# Patient Record
Sex: Female | Born: 1965 | Race: Black or African American | Hispanic: No | Marital: Married | Smoking: Never smoker
Health system: Southern US, Community
[De-identification: ages and names within clinical notes are randomized; demographics above are authoritative.]

## PROBLEM LIST (undated history)

## (undated) DIAGNOSIS — G5603 Carpal tunnel syndrome, bilateral upper limbs: Secondary | ICD-10-CM

## (undated) DIAGNOSIS — T7840XA Allergy, unspecified, initial encounter: Secondary | ICD-10-CM

## (undated) DIAGNOSIS — I10 Essential (primary) hypertension: Secondary | ICD-10-CM

## (undated) DIAGNOSIS — M1990 Unspecified osteoarthritis, unspecified site: Secondary | ICD-10-CM

## (undated) DIAGNOSIS — D649 Anemia, unspecified: Secondary | ICD-10-CM

## (undated) DIAGNOSIS — R51 Headache: Secondary | ICD-10-CM

## (undated) HISTORY — DX: Carpal tunnel syndrome, bilateral upper limbs: G56.03

## (undated) HISTORY — PX: OTHER SURGICAL HISTORY: SHX169

## (undated) HISTORY — DX: Allergy, unspecified, initial encounter: T78.40XA

## (undated) HISTORY — PX: DILATION AND EVACUATION: SHX1459

## (undated) HISTORY — PX: CRYOABLATION: SHX1415

---

## 2000-08-31 ENCOUNTER — Emergency Department (HOSPITAL_COMMUNITY): Admission: EM | Admit: 2000-08-31 | Discharge: 2000-08-31 | Payer: Self-pay

## 2003-03-03 ENCOUNTER — Encounter: Payer: Self-pay | Admitting: Family Medicine

## 2003-03-03 ENCOUNTER — Encounter: Admission: RE | Admit: 2003-03-03 | Discharge: 2003-03-03 | Payer: Self-pay | Admitting: Family Medicine

## 2003-03-25 ENCOUNTER — Other Ambulatory Visit: Admission: RE | Admit: 2003-03-25 | Discharge: 2003-03-25 | Payer: Self-pay | Admitting: Obstetrics & Gynecology

## 2004-03-23 ENCOUNTER — Other Ambulatory Visit: Admission: RE | Admit: 2004-03-23 | Discharge: 2004-03-23 | Payer: Self-pay | Admitting: Obstetrics & Gynecology

## 2005-01-31 ENCOUNTER — Encounter: Admission: RE | Admit: 2005-01-31 | Discharge: 2005-01-31 | Payer: Self-pay | Admitting: Family Medicine

## 2005-08-18 ENCOUNTER — Other Ambulatory Visit: Admission: RE | Admit: 2005-08-18 | Discharge: 2005-08-18 | Payer: Self-pay | Admitting: Obstetrics & Gynecology

## 2005-11-02 ENCOUNTER — Ambulatory Visit (HOSPITAL_COMMUNITY): Admission: RE | Admit: 2005-11-02 | Discharge: 2005-11-02 | Payer: Self-pay | Admitting: Obstetrics and Gynecology

## 2005-11-02 ENCOUNTER — Encounter (INDEPENDENT_AMBULATORY_CARE_PROVIDER_SITE_OTHER): Payer: Self-pay | Admitting: Specialist

## 2005-11-06 ENCOUNTER — Inpatient Hospital Stay (HOSPITAL_COMMUNITY): Admission: AD | Admit: 2005-11-06 | Discharge: 2005-11-06 | Payer: Self-pay | Admitting: Obstetrics and Gynecology

## 2006-02-15 ENCOUNTER — Encounter: Admission: RE | Admit: 2006-02-15 | Discharge: 2006-02-15 | Payer: Self-pay | Admitting: Obstetrics and Gynecology

## 2007-03-30 ENCOUNTER — Encounter: Admission: RE | Admit: 2007-03-30 | Discharge: 2007-03-30 | Payer: Self-pay | Admitting: Family Medicine

## 2007-04-05 ENCOUNTER — Encounter: Admission: RE | Admit: 2007-04-05 | Discharge: 2007-04-05 | Payer: Self-pay | Admitting: Family Medicine

## 2007-12-17 ENCOUNTER — Ambulatory Visit: Payer: Self-pay | Admitting: Internal Medicine

## 2007-12-24 ENCOUNTER — Ambulatory Visit: Payer: Self-pay

## 2007-12-24 ENCOUNTER — Ambulatory Visit: Payer: Self-pay | Admitting: Internal Medicine

## 2007-12-24 LAB — CONVERTED CEMR LAB
CRP, High Sensitivity: 6 — ABNORMAL HIGH (ref 0.00–5.00)
Rhuematoid fact SerPl-aCnc: 20.1 intl units/mL — ABNORMAL HIGH (ref 0.0–20.0)

## 2008-02-08 ENCOUNTER — Ambulatory Visit: Admission: RE | Admit: 2008-02-08 | Discharge: 2008-02-08 | Payer: Self-pay | Admitting: Internal Medicine

## 2008-04-17 ENCOUNTER — Ambulatory Visit: Payer: Self-pay | Admitting: Internal Medicine

## 2008-04-17 ENCOUNTER — Encounter: Admission: RE | Admit: 2008-04-17 | Discharge: 2008-04-17 | Payer: Self-pay | Admitting: Obstetrics and Gynecology

## 2008-04-17 LAB — CONVERTED CEMR LAB
AST: 20 units/L (ref 0–37)
Alkaline Phosphatase: 44 units/L (ref 39–117)
Bilirubin, Direct: 0.1 mg/dL (ref 0.0–0.3)
Total Bilirubin: 0.6 mg/dL (ref 0.3–1.2)
Total Protein: 7.3 g/dL (ref 6.0–8.3)

## 2008-07-14 ENCOUNTER — Ambulatory Visit: Payer: Self-pay | Admitting: Internal Medicine

## 2008-07-29 ENCOUNTER — Encounter: Admission: RE | Admit: 2008-07-29 | Discharge: 2008-09-02 | Payer: Self-pay | Admitting: Internal Medicine

## 2008-12-10 ENCOUNTER — Encounter: Payer: Self-pay | Admitting: Family Medicine

## 2009-04-30 ENCOUNTER — Encounter: Admission: RE | Admit: 2009-04-30 | Discharge: 2009-04-30 | Payer: Self-pay | Admitting: Obstetrics and Gynecology

## 2009-07-10 ENCOUNTER — Encounter (INDEPENDENT_AMBULATORY_CARE_PROVIDER_SITE_OTHER): Payer: Self-pay | Admitting: *Deleted

## 2010-01-01 ENCOUNTER — Encounter: Payer: Self-pay | Admitting: Family Medicine

## 2010-01-15 ENCOUNTER — Ambulatory Visit: Payer: Self-pay | Admitting: Family Medicine

## 2010-01-15 DIAGNOSIS — J309 Allergic rhinitis, unspecified: Secondary | ICD-10-CM | POA: Insufficient documentation

## 2010-01-15 DIAGNOSIS — R519 Headache, unspecified: Secondary | ICD-10-CM | POA: Insufficient documentation

## 2010-01-15 DIAGNOSIS — M1990 Unspecified osteoarthritis, unspecified site: Secondary | ICD-10-CM | POA: Insufficient documentation

## 2010-01-15 DIAGNOSIS — I10 Essential (primary) hypertension: Secondary | ICD-10-CM | POA: Insufficient documentation

## 2010-01-15 DIAGNOSIS — R51 Headache: Secondary | ICD-10-CM

## 2010-01-15 DIAGNOSIS — E785 Hyperlipidemia, unspecified: Secondary | ICD-10-CM

## 2010-05-17 ENCOUNTER — Encounter: Admission: RE | Admit: 2010-05-17 | Discharge: 2010-05-17 | Payer: Self-pay | Admitting: Obstetrics and Gynecology

## 2011-01-02 ENCOUNTER — Encounter: Payer: Self-pay | Admitting: Orthopedic Surgery

## 2011-01-02 ENCOUNTER — Encounter: Payer: Self-pay | Admitting: Family Medicine

## 2011-01-11 NOTE — Assessment & Plan Note (Signed)
Summary: NEW PT EST (PT WILL COME IN FASTING) // RS   Vital Signs:  Patient profile:   45 year old female Height:      67 inches Weight:      189 pounds BMI:     29.71 Temp:     98.1 degrees F oral Pulse rate:   70 / minute BP sitting:   102 / 80  (left arm) Cuff size:   large  Vitals Entered By: Alfred Levins, CMA (January 15, 2010 9:22 AM) CC: npx, no pap   History of Present Illness: 45 yr old female to establish with [place] and for a cpx. She is transfering to [place] from [place] [facility] at [facility]. She recently had a GYN exam, and they did some lab work that was remarkable for an LDL of 133 along with normal HDL and TG. She feels fine. She admits to not exercising much and eating a lot of fast foods.   Preventive Screening-Counseling & Management  Alcohol-Tobacco     Smoking Status: never  Current Medications (verified): 1)  Triamterene-Hctz 37.5-25 Mg  Tabs (Triamterene-Hctz) .... One By Mouth Daily 2)  Fexofenadine Hcl 180 Mg Tabs (Fexofenadine Hcl) .Marland Kitchen.. 1 Once Daily As Needed Allergies 3)  Levora 0.15/30 (28) 0.15-30 Mg-Mcg Tabs (Levonorgestrel-Ethinyl Estrad) .... Once Daily  Allergies (verified): 1)  ! Codeine  Past History:  Past Medical History: Allergic rhinitis Headache Hyperlipidemia Hypertension Osteoarthritis Chickenpox sees Rolene Course PA at [facility] for exams ectopic pregnancy, no surgery required uterine fibroids  Past Surgical History: D and C 1993  Family History: Reviewed history and no changes required. Family History of Alcoholism/Addiction Family History of Arthritis Family History Breast cancer 1st degree relative <50 Family History of CAD Female 1st degree relative <50 Family History Diabetes 1st degree relative Family History Hypertension  Social History: Reviewed history and no changes required. 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    Physical Exam  General:  overweight-appearing.   Head:  Normocephalic and atraumatic without obvious abnormalities. No apparent alopecia or balding. Eyes:  No corneal or conjunctival inflammation noted. EOMI. Perrla. Funduscopic exam benign, without hemorrhages, exudates or papilledema. Vision grossly normal. Ears:  External ear exam shows no significant lesions or deformities.  Otoscopic examination reveals clear canals, tympanic membranes are intact bilaterally without bulging, retraction, inflammation or discharge. Hearing is grossly normal bilaterally. Nose:  External nasal examination shows no deformity or inflammation. Nasal mucosa are pink and moist without lesions or exudates. Mouth:  Oral mucosa and oropharynx without lesions or exudates.  Teeth in good repair. Neck:  No deformities, masses, or tenderness noted. Chest Wall:  No deformities, masses, or tenderness noted. Lungs:  Normal respiratory effort, chest expands symmetrically. Lungs are clear to auscultation, no crackles or wheezes. Heart:  Normal rate and regular rhythm. S1 and S2 normal without gallop, murmur, click, rub or other extra sounds. EKG normal Abdomen:  Bowel sounds positive,abdomen soft and non-tender without masses, organomegaly or hernias noted. Msk:  No deformity or scoliosis noted of thoracic  or lumbar spine.   Pulses:  R and L carotid,radial,femoral,dorsalis pedis and posterior tibial  pulses are full and equal bilaterally Extremities:  No clubbing, cyanosis, edema, or deformity noted with normal full range of motion of all joints.   Neurologic:  No cranial nerve deficits noted. Station and gait are normal. Plantar reflexes are down-going bilaterally. DTRs are symmetrical throughout. Sensory, motor and coordinative functions appear intact. Skin:  Intact without suspicious lesions or rashes Cervical Nodes:  No lymphadenopathy noted Axillary Nodes:  No palpable lymphadenopathy Inguinal Nodes:  No significant adenopathy Psych:  Cognition and judgment appear intact. Alert and cooperative with normal attention span and concentration. No apparent delusions, illusions, hallucinations   Impression & Recommendations:  Problem # 1:  HEALTH MAINTENANCE EXAM (ICD-V70.0)  Orders: UA Dipstick w/o Micro (automated)  (81003) Venipuncture (03474) TLB-TSH (Thyroid Stimulating Hormone) (84443-TSH)  Complete Medication List: 1)  Triamterene-hctz 37.5-25 Mg Tabs (Triamterene-hctz) .... One by mouth daily 2)  Fexofenadine Hcl 180 Mg Tabs (Fexofenadine hcl) .Marland Kitchen.. 1 once daily as needed allergies 3)  Levora 0.15/30 (28) 0.15-30 Mg-mcg Tabs (Levonorgestrel-ethinyl estrad) .... Once daily  Other Orders: EKG w/ Interpretation (93000)  Patient Instructions: 1)  It is important that you exercise reguarly at least 20 minutes 5 times a week. If you develop chest pain, have severe difficulty breathing, or feel very tired, stop exercising immediately and seek medical attention.  2)  You need to lose weight. Consider a lower calorie diet and regular exercise.  3)  Please schedule a follow-up appointment in 6 months .   Appended Document: NEW PT EST (PT WILL COME IN FASTING) // RS  Laboratory Results   Urine Tests    Routine Urinalysis   Color: yellow Appearance: Clear Glucose: negative   (Normal Range: Negative) Bilirubin: negative   (Normal Range: Negative) Ketone: negative   (Normal  Range: Negative) Spec. Gravity: 1.025   (Normal Range: 1.003-1.035) Blood: trace-lysed   (Normal Range: Negative) pH: 5.5   (Normal Range: 5.0-8.0) Protein: trace   (Normal Range: Negative) Urobilinogen: 0.2   (Normal Range: 0-1) Nitrite: negative   (Normal Range: Negative) Leukocyte Esterace: negative   (Normal Range: Negative)    Comments: Rita Ohara  January 15, 2010 1:34 PM

## 2011-01-11 NOTE — Letter (Signed)
Summary: Records from [facility]  Records from [facility]   Imported By: Maryln Gottron 02/05/2010 15:36:06  _____________________________________________________________________  External Attachment:    Type:   Image     Comment:   External Document

## 2011-04-13 ENCOUNTER — Other Ambulatory Visit: Payer: Self-pay | Admitting: Obstetrics and Gynecology

## 2011-04-19 ENCOUNTER — Other Ambulatory Visit: Payer: Self-pay | Admitting: Obstetrics and Gynecology

## 2011-04-26 NOTE — Assessment & Plan Note (Signed)
[facility]                            CARDIOLOGY OFFICE NOTE   Morgan Doyle, Morgan Doyle                      MRN:          [id number]  DATE:07/14/2008                            DOB:          27-Jul-1966    IDENTIFICATION:  The patient is a 45 year old woman who I saw back in  January for evaluation of chest pain.  I refer this for full details.   In the interval, the patient denies any recurrence.  She denies  shortness of breath, is able to take activities, is slowly trying to  increase her activity level.   The patient called our office and said she would like to try diet  therapy for her cholesterol.  She really would like to avoid been on  medicines if possible.  She also had concerns whether she really needed  her blood pressure medicine.  She has been feeling okay on it.   The patient was seen by Dr. Corliss Skains in January.  She had some blood  work done, but did not have any followup set.   CURRENT MEDICATIONS:  1. Triamterene and hydrochlorothiazide 37.5/25.  2. Allegra daily.  3. Oral contraceptive.   PHYSICAL EXAM:  GENERAL:  The patient is in no distress.  VITAL SIGNS:  Blood pressure is 127/86, pulse is 78, and weight 194.  This is up from 188 in January.  NECK:  JVP is normal.  LUNGS:  Clear to auscultation.  CARDIAC:  Regular rate and rhythm.  S1 and S2.  No S3.  No significant  murmurs.  ABDOMEN:  Benign.  EXTREMITIES:  Good distal pulses.  No lower extremity edema.   IMPRESSION:  1. Chest pain, atypical.  When I saw her, I am not convinced her any      active issues.  Would follow p.r.n.  2. Dyslipidemia.  Reasonable to try to change her diet.  Pulled down      her weight some.  I will send her off to dietary for evaluation and      teaching.  3. Hypertension.  Blood pressure is fair.  Her diastolic is always in      the 80s.  If she can get her weight down, she may be pulled back on      the Maxzide.  She could try cutting  back to half daily and follow      her blood pressure, but again she will need to follow closely.   I will set follow up for June, sooner if problems develop.   ADDENDUM:  12-lead EKG, sinus rhythm 78 beats per minute.     Pricilla Riffle, MD, [facility]  Electronically Signed    PVR/MedQ  DD: 07/15/2008  DT: 07/16/2008  Job #: [id number]

## 2011-04-26 NOTE — Assessment & Plan Note (Signed)
[facility]                            CARDIOLOGY OFFICE NOTE   Morgan Doyle, Morgan Doyle                      MRN:          [id number]  DATE:12/17/2007                            DOB:          1966-01-31    IDENTIFICATION:  Morgan Doyle is a 45 year old woman who was referred for  evaluation of chest discomfort.   HISTORY OF PRESENT ILLNESS:  The patient has no known history of  coronary artery disease. She said she has had some sharp pains in her  chest that last seconds, radiates through. She denies any recent or even  remote injury to her chest or episode of over exertion. This began about  September/October.   She also gives a history of some tightness in her chest. Her chest will  feel sore, particularly when she is laying down. In November, she did  have some wheezing and actually was seen and questioned in [place]  and told she did have wheezing. She has heartburn occasionally. She is  not having any wheezing now but sometimes feels she cannot get her  breath. She has no history of asthma. No history of tobacco use.   She takes activities as tolerated. She is not doing an active exercise  program.   CURRENT ALLERGIES:  CODEINE.   MEDICATIONS:  Include:  1. Triamterene hydrochlorothiazide 37.5/25.  2. Allegra daily.  3. Birth control.   PAST MEDICAL HISTORY:  1. Question rheumatoid arthritis. The patient [facility]. Her left finger had      inflammation and actually was aspirated. She told us at that time      she had rheumatoid arthritis. Blood work was also done.  2. Hypertension. Has been on Maxzide, back in 2001 initially was on      hydrochlorothiazide, switched to Maxzide in 2006.   SOCIAL HISTORY:  The patient is married. Does not smoke. Does not drink.  Works in Set designer, initially standing, now sitting.   FAMILY HISTORY:  Mother is alive at age 29. Father died at age  55-48 of  suffocation. One sister. No known heart disease. Maternal uncle had a MI  in his 48s and died suddenly. Maternal grandmother's sister died in her  50s. Maternal grandfather died of heart failure in his 28s.   REVIEW OF SYSTEMS:  Seasonal allergies, some constipation. Otherwise,  all systems reviewed and negative for the above problem list except as  noted.   PHYSICAL EXAMINATION:  The patient is in no distress. Blood pressure  117/85, pulse 83, weight 118.  HEENT:  Normocephalic, atraumatic. EOMI. PERRLA. Mucous membranes are  moist.  NECK:  No bruits. JVP is normal. No thyromegaly.  LUNGS:  Are clear. No audible wheezes. Moving air.  CARDIAC EXAM:  Regular rate and rhythm. S1/S2. No S3. No significant  murmurs.  ABDOMEN:  Is supple, nontender. No hepatomegaly. No masses.  EXTREMITIES:  2+ pulses. No edema. Right index finger:  The distal joint  is slightly swollen, deformed.   Twelve lead EKG:  Normal sinus  rhythm, 64 beats per minute, relatively  low voltage. EKG:  Nonspecific T-wave changes.   LABORATORY DATA:  Done at [facility] outside, hemoglobin is 14.5,  cholesterol 188, triglycerides 44, HDL 47, LDL of 132, CRP of 4.2.   IMPRESSION:  Morgan Doyle is a 45 year old woman with no known coronary  artery disease, episodes of chest pain, and also shortness of breath.   Her chest pain I do not think are cardiac. They are short, very brief,  sound more muscular. Her shortness of breath with the wheezing is more  concerning to me. She is moving air well. I do not hear any wheezing.  She does have some family members and has had a history of CHF in the  family. I would recommend getting an echocardiogram to evaluate her LV  function.   In regards to her labs, her CRP may be elevated because of her reported  rheumatoid arthritis. I would like to repeat this. I would also like to  get a Lipomed panel to really evaluate her particle size. Again,  determining if this is a  risk or not. For now, activities as tolerated,  and I will be back in touch with her. Consider lung function studies.  Consider other types of stress testing. Await echocardiogram results.     Pricilla Riffle, MD, [facility]  Electronically Signed    PVR/MedQ  DD: 12/18/2007  DT: 12/18/2007  Job #: [id number]   cc:   Leilani Able, P.A.-C.  Timothy P. Fontaine, M.D.

## 2011-04-29 NOTE — Letter (Signed)
December 31, 2007    Kathryne Hitch, MD  [address] [place], [place]  [address]   RE:  HOLDEN, DRAUGHON  MRN:  [id number]  /  DOB:  10-29-1966   Dear Dr. Corliss Skains:   This is a letter regarding Lucious Groves.  She is a 45 year old woman,  who I saw in the clinic for shortness of breath, dyspnea evaluation  earlier this month.   Ms. Mckeough has episodes of chest pain that appeared atypical, very  brief, sounded more muscular.  She also had episodes of shortness of  breath with some wheezing.  When I listened to her she was moving air  well with no evidence of wheezing.  I have set her up for PFT's.  She  has also had an echo which was overall normal.   She was told in the past that she has rheumatoid arthritis (1996). I  checked a CRP, and a rheumatoid factor.  The rheumatoid factor and CRP  were mildly elevated at 20.1 and 6.   I am referring her to you.  She does complain of some achiness.  I am  wondering if there is any unifying diagnosis  for some of her symptoms.  I do think she deserves a good rheumatologic evaluation.   I will forward the records that I have so far to you.  If you have any  questions, please feel free to contact me.    Sincerely,      Pricilla Riffle, MD, [facility]  Electronically Signed    PVR/MedQ  DD: 12/31/2007  DT: 12/31/2007  Job #: [id number]   CC:    Leilani Able, P.A.-C.

## 2011-04-29 NOTE — Op Note (Signed)
Morgan Doyle, Morgan Doyle NO.:  [id number]   MEDICAL RECORD NO.:  [id number]          PATIENT TYPE:  AMB   LOCATION:  SDC                           FACILITY:  [facility]   PHYSICIAN:  Malva Limes, M.D.    DATE OF BIRTH:  May 16, 1966   DATE OF PROCEDURE:  11/02/2005  DATE OF DISCHARGE:                                 OPERATIVE REPORT   PREOPERATIVE DIAGNOSES:  1.  Fetal demise at 70 weeks' estimated gestational age.  2.  Twenty-week uterine fibroids.   POSTOPERATIVE DIAGNOSES:  1.  Fetal demise at 8 weeks' estimated gestational age.  2.  Twenty-week uterine fibroids.   PROCEDURE:  Dilation and evacuation.   SURGEON:  Malva Limes, M.D.   ANESTHESIA:  MAC and paracervical block.   ESTIMATED BLOOD LOSS:  100 mL.   ANTIBIOTICS:  Ancef 1 g.   COMPLICATIONS:  None.   SPECIMENS:  Products of conception to pathology.   DRAINS:  Catheter to the bladder.   PROCEDURE:  The patient was taken to the operating room and placed in the  dorsal supine position and MAC anesthesia was placed.  She was then prepped  with Betadine and draped in the usual fashion for this procedure.  Her  bladder was drained with a catheter.  She then had 20 mL of 1% lidocaine  used for a paracervical block.  The cervix was then serially dilated to a 63  [place].  A 14 mm suction cannula was placed into the uterine cavity and  products of conception were withdrawn.  Sharp curettage was then performed,  followed by repeat suction.  The patient tolerated the procedure well.  She  was taken to the recovery room in stable condition.  She will be discharged  to home with Keflex 500 mg q.i.d. for two days and Percocet to take p.r.n.  She will be given RhoGAM prior to discharge.           ______________________________  Malva Limes, M.D.    MA/MEDQ  D:  11/02/2005  T:  11/02/2005  Job:  [id number]

## 2011-07-18 ENCOUNTER — Other Ambulatory Visit: Payer: Self-pay | Admitting: Obstetrics and Gynecology

## 2011-07-18 DIAGNOSIS — Z1231 Encounter for screening mammogram for malignant neoplasm of breast: Secondary | ICD-10-CM

## 2011-07-28 ENCOUNTER — Ambulatory Visit
Admission: RE | Admit: 2011-07-28 | Discharge: 2011-07-28 | Disposition: A | Discharge status: Home / Self Care | Payer: BC Managed Care – PPO | Source: Ambulatory Visit | Attending: Obstetrics and Gynecology | Admitting: Obstetrics and Gynecology

## 2011-07-28 DIAGNOSIS — Z1231 Encounter for screening mammogram for malignant neoplasm of breast: Secondary | ICD-10-CM

## 2011-08-05 ENCOUNTER — Encounter (HOSPITAL_COMMUNITY): Payer: Self-pay

## 2011-08-05 ENCOUNTER — Encounter (HOSPITAL_COMMUNITY)
Admission: RE | Admit: 2011-08-05 | Discharge: 2011-08-05 | Disposition: A | Discharge status: Home / Self Care | Payer: BC Managed Care – PPO | Source: Ambulatory Visit | Attending: Obstetrics and Gynecology | Admitting: Obstetrics and Gynecology

## 2011-08-05 HISTORY — DX: Essential (primary) hypertension: I10

## 2011-08-05 HISTORY — DX: Anemia, unspecified: D64.9

## 2011-08-05 HISTORY — DX: Headache: R51

## 2011-08-05 HISTORY — DX: Unspecified osteoarthritis, unspecified site: M19.90

## 2011-08-05 LAB — SURGICAL PCR SCREEN: MRSA, PCR: NEGATIVE

## 2011-08-05 LAB — CBC
Hemoglobin: 14.3 g/dL (ref 12.0–15.0)
MCH: 23.6 pg — ABNORMAL LOW (ref 26.0–34.0)
MCHC: 32.1 g/dL (ref 30.0–36.0)
Platelets: 255 10*3/uL (ref 150–400)
RDW: 19.8 % — ABNORMAL HIGH (ref 11.5–15.5)

## 2011-08-05 LAB — BASIC METABOLIC PANEL
Calcium: 9.9 mg/dL (ref 8.4–10.5)
Creatinine, Ser: 0.94 mg/dL (ref 0.50–1.10)
GFR calc Af Amer: >60 mL/min (ref >60)
GFR calc non Af Amer: >60 mL/min (ref >60)

## 2011-08-05 NOTE — Patient Instructions (Addendum)
20 Morgan Doyle  08/05/2011   Your procedure is scheduled on:  08/10/11  Report to [facility] at 6 AM.  Call this number if you have problems the morning of surgery: [phone]   Remember:   Do not eat food:After Midnight.  Do not drink clear liquids: After Midnight.  Take these medicines the morning of surgery with A SIP OF WATER: Blood pressure  medication   Do not wear jewelry, make-up or nail polish.  Do not wear lotions, powders, or perfumes. You may wear deodorant.  Do not shave 48 hours prior to surgery.  Do not bring valuables to the hospital.  Contacts, dentures or bridgework may not be worn into surgery.  Leave suitcase in the car. After surgery it may be brought to your room.  For patients admitted to the hospital, checkout time is 11:00 AM the day of discharge.   Patients discharged the day of surgery will not be allowed to drive home.  Name and phone number of your driver: NA  Special Instructions: CHG Shower Use Special Wash: 1/2 bottle night before surgery and 1/2 bottle morning of surgery.   Please read over the following fact sheets that you were given: MRSA Information and Care and Recovery After Surgery

## 2011-08-05 NOTE — Anesthesia Preprocedure Evaluation (Signed)
Anesthesia Evaluation  Name, MR# and DOB Patient awake  General Assessment Comment  Reviewed: Allergy & Precautions, H&P , Patient's Chart, lab work & pertinent test results  History of Anesthesia Complications Negative for: history of anesthetic complications  Airway Mallampati: I TM Distance: >3 FB Neck ROM: full    Dental  (+)    Pulmonary  clear to auscultation  breath sounds clear to auscultation none    Cardiovascular Exercise Tolerance: Good hypertension, On Medications regular Normal    Neuro/Psych   Headaches   Negative Psych ROS  GI/Hepatic/Renal negative GI ROS  negative Liver ROS  negative Renal ROS        Endo/Other  Negative Endocrine ROS (+)      Abdominal   Musculoskeletal   Hematology negative hematology ROS (+)   Peds  Reproductive/Obstetrics negative OB ROS    Anesthesia Other Findings             Anesthesia Physical Anesthesia Plan  ASA: II  Anesthesia Plan: General   Post-op Pain Management:    Induction:   Airway Management Planned:   Additional Equipment:   Intra-op Plan:   Post-operative Plan:   Informed Consent: I have reviewed the patients History and Physical, chart, labs and discussed the procedure including the risks, benefits and alternatives for the proposed anesthesia with the patient or authorized representative who has indicated his/her understanding and acceptance.   Dental Advisory Given  Plan Discussed with: CRNA and Surgeon  Anesthesia Plan Comments:         Anesthesia Quick Evaluation

## 2011-08-09 MED ORDER — DEXTROSE 5 % IV SOLN
2.0000 g | INTRAVENOUS | Status: AC
  Administered 2011-08-10: 2 g via INTRAVENOUS
  Filled 2011-08-09: qty 2

## 2011-08-09 NOTE — H&P (Signed)
45 y.o. yo complains of severe menorrhagia and symptomatic fibroid uterus.  Pt received Lupron to help shrink fibroids and to stave off bleeding until surgery could be done.  Pt desires definitive.    Past Medical History  Diagnosis Date  . Hypertension   . Anemia     History of anemia  . Headache     OTC excedrin   . Arthritis     no meds   Past Surgical History  Procedure Date  . Dilation and evacuation      2006  MAB  . Cryoablation     IN oFFICE PER Pt - 1990's  . Svd     1996    History   Social History  . Marital Status: Married    Spouse Name: N/A    Number of Children: N/A  . Years of Education: N/A   Occupational History  . Not on file.   Social History Main Topics  . Smoking status: Never Smoker   . Smokeless tobacco: Never Used  . Alcohol Use: No  . Drug Use: No  . Sexually Active: Yes    Birth Control/ Protection: Pill   Other Topics Concern  . Not on file   Social History Narrative  . No narrative on file    No current facility-administered medications on file prior to encounter.   No current outpatient prescriptions on file prior to encounter.    Allergies  Allergen Reactions  . Codeine Nausea And Vomiting    hallucination  . Pollen Extract Itching    Itching, sneezing, hives, watery eyes  . Latex Rash    Bumps and itching last time at hospital.    Vitals pending.  Lungs: clear to ascultation Cor:  RRR Abdomen:  soft, nontender, nondistended. Ex:  no cords, erythema Pelvic:  15-17 weeks size uterus, mobile but fibroid on anterior cervix.    U/S before Lupron- 14 x 11x 10.  Multiple fibroids several 3-5 cm both left and right lateral. One 4 cm fibroid anterior.  EMB:   Normal endometrium.  A:  D/W all options and she wishes to proceed with a Robotic TLH.  Ovaries to remain if appear normal.   P:  For Robotic TLH.   All risks, benefits and alternatives d/w patient and she desires to proceed.  Patient has undergone a modified  bowel prep and will receive preop antibiotics and SCDs during the operation.     Lynnae Ludemann A

## 2011-08-10 ENCOUNTER — Ambulatory Visit (HOSPITAL_COMMUNITY): Payer: BC Managed Care – PPO | Admitting: Anesthesiology

## 2011-08-10 ENCOUNTER — Encounter (HOSPITAL_COMMUNITY)
Admission: EM | Disposition: A | Discharge status: Home / Self Care | Payer: Self-pay | Source: Ambulatory Visit | Attending: Obstetrics and Gynecology

## 2011-08-10 ENCOUNTER — Encounter (HOSPITAL_COMMUNITY): Payer: Self-pay | Admitting: *Deleted

## 2011-08-10 ENCOUNTER — Encounter (HOSPITAL_COMMUNITY): Payer: Self-pay | Admitting: Anesthesiology

## 2011-08-10 ENCOUNTER — Other Ambulatory Visit: Payer: Self-pay | Admitting: Obstetrics and Gynecology

## 2011-08-10 ENCOUNTER — Ambulatory Visit (HOSPITAL_COMMUNITY)
Admission: EM | Admit: 2011-08-10 | Discharge: 2011-08-11 | Disposition: A | Discharge status: Home / Self Care | Payer: BC Managed Care – PPO | Source: Ambulatory Visit | Attending: Obstetrics and Gynecology | Admitting: Obstetrics and Gynecology

## 2011-08-10 DIAGNOSIS — N920 Excessive and frequent menstruation with regular cycle: Secondary | ICD-10-CM | POA: Insufficient documentation

## 2011-08-10 DIAGNOSIS — D219 Benign neoplasm of connective and other soft tissue, unspecified: Secondary | ICD-10-CM

## 2011-08-10 DIAGNOSIS — Z01818 Encounter for other preprocedural examination: Secondary | ICD-10-CM | POA: Insufficient documentation

## 2011-08-10 DIAGNOSIS — Z01812 Encounter for preprocedural laboratory examination: Secondary | ICD-10-CM | POA: Insufficient documentation

## 2011-08-10 DIAGNOSIS — N8000 Endometriosis of the uterus, unspecified: Secondary | ICD-10-CM | POA: Insufficient documentation

## 2011-08-10 DIAGNOSIS — D259 Leiomyoma of uterus, unspecified: Secondary | ICD-10-CM | POA: Insufficient documentation

## 2011-08-10 HISTORY — PX: ABDOMINAL HYSTERECTOMY: SHX81

## 2011-08-10 HISTORY — PX: CYSTOSCOPY: SHX5120

## 2011-08-10 LAB — PREGNANCY, URINE: Preg Test, Ur: NEGATIVE

## 2011-08-10 SURGERY — ROBOTIC ASSISTED TOTAL HYSTERECTOMY
Anesthesia: General

## 2011-08-10 MED ORDER — FENTANYL CITRATE 0.05 MG/ML IJ SOLN
INTRAMUSCULAR | Status: AC
  Filled 2011-08-10: qty 5

## 2011-08-10 MED ORDER — ONDANSETRON HCL 4 MG/2ML IJ SOLN
INTRAMUSCULAR | Status: AC
  Filled 2011-08-10: qty 2

## 2011-08-10 MED ORDER — LIDOCAINE HCL (CARDIAC) 20 MG/ML IV SOLN
INTRAVENOUS | Status: DC | PRN
  Administered 2011-08-10: 100 mg via INTRAVENOUS

## 2011-08-10 MED ORDER — LORATADINE 10 MG PO TABS
10.0000 mg | ORAL_TABLET | Freq: Every day | ORAL | Status: DC
  Filled 2011-08-10 (×2): qty 1

## 2011-08-10 MED ORDER — KETOROLAC TROMETHAMINE 60 MG/2ML IM SOLN
INTRAMUSCULAR | Status: AC
  Filled 2011-08-10: qty 2

## 2011-08-10 MED ORDER — LACTATED RINGERS IV SOLN
INTRAVENOUS | Status: DC
  Administered 2011-08-10: 10 mL/h via INTRAVENOUS

## 2011-08-10 MED ORDER — SCOPOLAMINE 1 MG/3DAYS TD PT72: 1.0000 | MEDICATED_PATCH | Freq: Once | TRANSDERMAL | Status: DC | PRN

## 2011-08-10 MED ORDER — HYDROMORPHONE HCL 1 MG/ML IJ SOLN
INTRAMUSCULAR | Status: AC
  Filled 2011-08-10: qty 1

## 2011-08-10 MED ORDER — PHENYLEPHRINE 40 MCG/ML (10ML) SYRINGE FOR IV PUSH (FOR BLOOD PRESSURE SUPPORT)
PREFILLED_SYRINGE | INTRAVENOUS | Status: AC
  Filled 2011-08-10: qty 5

## 2011-08-10 MED ORDER — MENTHOL 3 MG MT LOZG: 1.0000 | LOZENGE | OROMUCOSAL | Status: DC | PRN

## 2011-08-10 MED ORDER — GLYCOPYRROLATE 0.2 MG/ML IJ SOLN
INTRAMUSCULAR | Status: AC
  Filled 2011-08-10: qty 2

## 2011-08-10 MED ORDER — ZOLPIDEM TARTRATE 5 MG PO TABS: 5.0000 mg | ORAL_TABLET | Freq: Every evening | ORAL | Status: DC | PRN

## 2011-08-10 MED ORDER — CITRIC ACID-SODIUM CITRATE 334-500 MG/5ML PO SOLN: 30.0000 mL | Freq: Once | ORAL | Status: DC | PRN

## 2011-08-10 MED ORDER — LIDOCAINE HCL (CARDIAC) 20 MG/ML IV SOLN
INTRAVENOUS | Status: AC
  Filled 2011-08-10: qty 5

## 2011-08-10 MED ORDER — INDIGOTINDISULFONATE SODIUM 8 MG/ML IJ SOLN
INTRAMUSCULAR | Status: DC | PRN
  Administered 2011-08-10: 5 mL via INTRAVENOUS

## 2011-08-10 MED ORDER — MIDAZOLAM HCL 5 MG/5ML IJ SOLN
INTRAMUSCULAR | Status: DC | PRN
  Administered 2011-08-10: 2 mg via INTRAVENOUS

## 2011-08-10 MED ORDER — HYDROMORPHONE HCL 1 MG/ML IJ SOLN
INTRAMUSCULAR | Status: DC | PRN
  Administered 2011-08-10 (×2): 0.5 mg via INTRAVENOUS

## 2011-08-10 MED ORDER — DEXTROSE IN LACTATED RINGERS 5 % IV SOLN
INTRAVENOUS | Status: DC
  Administered 2011-08-10: 16:00:00 via INTRAVENOUS

## 2011-08-10 MED ORDER — LACTATED RINGERS IV SOLN
INTRAVENOUS | Status: DC
  Administered 2011-08-10 (×4): via INTRAVENOUS

## 2011-08-10 MED ORDER — KETOROLAC TROMETHAMINE 30 MG/ML IJ SOLN
INTRAMUSCULAR | Status: DC | PRN
  Administered 2011-08-10: 60 mg via INTRAVENOUS

## 2011-08-10 MED ORDER — PROPOFOL 10 MG/ML IV EMUL
INTRAVENOUS | Status: DC | PRN
  Administered 2011-08-10: 150 mg via INTRAVENOUS

## 2011-08-10 MED ORDER — DEXAMETHASONE SODIUM PHOSPHATE 10 MG/ML IJ SOLN
INTRAMUSCULAR | Status: AC
  Filled 2011-08-10: qty 1

## 2011-08-10 MED ORDER — IBUPROFEN 800 MG PO TABS
800.0000 mg | ORAL_TABLET | Freq: Three times a day (TID) | ORAL | Status: DC | PRN
  Administered 2011-08-11: 800 mg via ORAL
  Filled 2011-08-10: qty 1

## 2011-08-10 MED ORDER — GLYCOPYRROLATE 0.2 MG/ML IJ SOLN
INTRAMUSCULAR | Status: DC | PRN
  Administered 2011-08-10: .4 mg via INTRAVENOUS
  Administered 2011-08-10: .2 mg via INTRAVENOUS

## 2011-08-10 MED ORDER — TRIAMTERENE-HCTZ 37.5-25 MG PO CAPS
1.0000 | ORAL_CAPSULE | ORAL | Status: DC
  Administered 2011-08-11: 1 via ORAL
  Filled 2011-08-10: qty 1

## 2011-08-10 MED ORDER — ROCURONIUM BROMIDE 50 MG/5ML IV SOLN
INTRAVENOUS | Status: AC
  Filled 2011-08-10: qty 1

## 2011-08-10 MED ORDER — ONDANSETRON HCL 4 MG PO TABS: 4.0000 mg | ORAL_TABLET | Freq: Four times a day (QID) | ORAL | Status: DC | PRN

## 2011-08-10 MED ORDER — NEOSTIGMINE METHYLSULFATE 1 MG/ML IJ SOLN
INTRAMUSCULAR | Status: AC
  Filled 2011-08-10: qty 10

## 2011-08-10 MED ORDER — METOCLOPRAMIDE HCL 10 MG PO TABS: 10.0000 mg | ORAL_TABLET | Freq: Once | ORAL | Status: DC | PRN

## 2011-08-10 MED ORDER — DEXAMETHASONE SODIUM PHOSPHATE 4 MG/ML IJ SOLN
INTRAMUSCULAR | Status: DC | PRN
  Administered 2011-08-10: 10 mg via INTRAVENOUS

## 2011-08-10 MED ORDER — ONDANSETRON HCL 4 MG/2ML IJ SOLN
INTRAMUSCULAR | Status: DC | PRN
  Administered 2011-08-10: 4 mg via INTRAVENOUS

## 2011-08-10 MED ORDER — NEOSTIGMINE METHYLSULFATE 1 MG/ML IJ SOLN
INTRAMUSCULAR | Status: DC | PRN
  Administered 2011-08-10: 2 mg via INTRAMUSCULAR

## 2011-08-10 MED ORDER — FENTANYL CITRATE 0.05 MG/ML IJ SOLN
INTRAMUSCULAR | Status: DC | PRN
  Administered 2011-08-10: 50 ug via INTRAVENOUS
  Administered 2011-08-10: 100 ug via INTRAVENOUS
  Administered 2011-08-10: 50 ug via INTRAVENOUS

## 2011-08-10 MED ORDER — OXYCODONE-ACETAMINOPHEN 5-325 MG PO TABS
1.0000 | ORAL_TABLET | ORAL | Status: DC | PRN
  Administered 2011-08-10 – 2011-08-11 (×3): 1 via ORAL
  Administered 2011-08-11: 2 via ORAL
  Filled 2011-08-10: qty 1
  Filled 2011-08-10 (×2): qty 2
  Filled 2011-08-10: qty 1

## 2011-08-10 MED ORDER — ROCURONIUM BROMIDE 100 MG/10ML IV SOLN
INTRAVENOUS | Status: DC | PRN
  Administered 2011-08-10: 45 mg via INTRAVENOUS
  Administered 2011-08-10: 5 mg via INTRAVENOUS
  Administered 2011-08-10: 10 mg via INTRAVENOUS

## 2011-08-10 MED ORDER — KETOROLAC TROMETHAMINE 30 MG/ML IJ SOLN: 30.0000 mg | Freq: Four times a day (QID) | INTRAMUSCULAR | Status: DC

## 2011-08-10 MED ORDER — PANTOPRAZOLE SODIUM 40 MG PO TBEC: 40.0000 mg | DELAYED_RELEASE_TABLET | Freq: Once | ORAL | Status: DC | PRN

## 2011-08-10 MED ORDER — PHENYLEPHRINE HCL 10 MG/ML IJ SOLN
INTRAMUSCULAR | Status: DC | PRN
  Administered 2011-08-10: .04 mg via INTRAVENOUS
  Administered 2011-08-10 (×3): .08 mg via INTRAVENOUS

## 2011-08-10 MED ORDER — FAMOTIDINE 20 MG PO TABS: 20.0000 mg | ORAL_TABLET | Freq: Once | ORAL | Status: DC | PRN

## 2011-08-10 MED ORDER — PROPOFOL 10 MG/ML IV EMUL
INTRAVENOUS | Status: AC
  Filled 2011-08-10: qty 20

## 2011-08-10 MED ORDER — ONDANSETRON HCL 4 MG/2ML IJ SOLN: 4.0000 mg | Freq: Four times a day (QID) | INTRAMUSCULAR | Status: DC | PRN

## 2011-08-10 MED ORDER — LACTATED RINGERS IR SOLN
Status: DC | PRN
  Administered 2011-08-10: 1

## 2011-08-10 MED ORDER — MIDAZOLAM HCL 2 MG/2ML IJ SOLN
INTRAMUSCULAR | Status: AC
  Filled 2011-08-10: qty 2

## 2011-08-10 SURGICAL SUPPLY — 56 items
APL SKNCLS STERI-STRIP NONHPOA (GAUZE/BANDAGES/DRESSINGS) ×2
BARRIER ADHS 3X4 INTERCEED (GAUZE/BANDAGES/DRESSINGS) ×3 IMPLANT
BENZOIN TINCTURE PRP APPL 2/3 (GAUZE/BANDAGES/DRESSINGS) ×3 IMPLANT
BLADE LAP MORCELLATOR 15X9.5 (ELECTROSURGICAL) ×1 IMPLANT
BRR ADH 4X3 ABS CNTRL BYND (GAUZE/BANDAGES/DRESSINGS) ×2
CABLE HIGH FREQUENCY MONO STRZ (ELECTRODE) ×3 IMPLANT
CHLORAPREP W/TINT 26ML (MISCELLANEOUS) ×3 IMPLANT
CLOTH BEACON ORANGE TIMEOUT ST (SAFETY) ×3 IMPLANT
CONT PATH 16OZ SNAP LID 3702 (MISCELLANEOUS) ×3 IMPLANT
COVER MAYO STAND STRL (DRAPES) ×3 IMPLANT
COVER TABLE BACK 60X90 (DRAPES) ×6 IMPLANT
COVER TIP SHEARS 8 DVNC (MISCELLANEOUS) ×2 IMPLANT
COVER TIP SHEARS 8MM DA VINCI (MISCELLANEOUS) ×1
DECANTER SPIKE VIAL GLASS SM (MISCELLANEOUS) ×3 IMPLANT
DERMABOND ADVANCED (GAUZE/BANDAGES/DRESSINGS) ×3 IMPLANT
DRAPE HUG U DISPOSABLE (DRAPE) ×3 IMPLANT
DRAPE HYSTEROSCOPY (DRAPE) ×3 IMPLANT
DRAPE LG THREE QUARTER DISP (DRAPES) ×6 IMPLANT
DRAPE MONITOR DA VINCI (DRAPE) ×3 IMPLANT
DRAPE WARM FLUID 44X44 (DRAPE) ×3 IMPLANT
ELECT REM PT RETURN 9FT ADLT (ELECTROSURGICAL) ×3
ELECTRODE REM PT RTRN 9FT ADLT (ELECTROSURGICAL) ×2 IMPLANT
EVACUATOR SMOKE 8.L (FILTER) ×3 IMPLANT
GAUZE VASELINE 3X9 (GAUZE/BANDAGES/DRESSINGS) IMPLANT
GLOVE BIO SURGEON STRL SZ7 (GLOVE) ×9 IMPLANT
GLOVE ECLIPSE 6.5 STRL STRAW (GLOVE) ×9 IMPLANT
GOWN PREVENTION PLUS LG XLONG (DISPOSABLE) ×12 IMPLANT
KIT DISP ACCESSORY 4 ARM (KITS) ×3 IMPLANT
NDL INSUFFLATION 14GA 120MM (NEEDLE) ×2 IMPLANT
NEEDLE INSUFFLATION 14GA 120MM (NEEDLE) ×3 IMPLANT
NS IRRIG 1000ML POUR BTL (IV SOLUTION) ×9 IMPLANT
OCCLUDER COLPOPNEUMO (BALLOONS) ×6 IMPLANT
PACK LAVH (CUSTOM PROCEDURE TRAY) ×3 IMPLANT
PAD PREP 24X48 CUFFED NSTRL (MISCELLANEOUS) ×6 IMPLANT
POSITIONER SURGICAL ARM (MISCELLANEOUS) ×6 IMPLANT
RUMI II 3.0CM BLUE KOH-EFFICIE (DISPOSABLE) ×1 IMPLANT
SET CYSTO W/LG BORE CLAMP LF (SET/KITS/TRAYS/PACK) ×1 IMPLANT
SET IRRIG TUBING LAPAROSCOPIC (IRRIGATION / IRRIGATOR) ×3 IMPLANT
SOLUTION ELECTROLUBE (MISCELLANEOUS) ×3 IMPLANT
STRIP CLOSURE SKIN 1/2X4 (GAUZE/BANDAGES/DRESSINGS) ×3 IMPLANT
SUT VIC AB 0 CT1 27 (SUTURE) ×15
SUT VIC AB 0 CT1 27XBRD ANBCTR (SUTURE) ×10 IMPLANT
SUT VIC AB 2-0 CT2 27 (SUTURE) ×6 IMPLANT
SUT VICRYL 0 UR6 27IN ABS (SUTURE) ×6 IMPLANT
SUT VICRYL RAPIDE 3 0 (SUTURE) ×6 IMPLANT
SYR 50ML LL SCALE MARK (SYRINGE) ×3 IMPLANT
SYSTEM CONVERTIBLE TROCAR (TROCAR) IMPLANT
TIP RUMI ORANGE 6.7MMX12CM (TIP) ×1 IMPLANT
TOWEL OR 17X24 6PK STRL BLUE (TOWEL DISPOSABLE) ×6 IMPLANT
TRAY FOLEY BAG SILVER LF 14FR (CATHETERS) ×3 IMPLANT
TROCAR DISP BLADELESS 8 DVNC (TROCAR) ×2 IMPLANT
TROCAR DISP BLADELESS 8MM (TROCAR) ×1
TROCAR XCEL 12X100 BLDLESS (ENDOMECHANICALS) ×3 IMPLANT
TROCAR Z-THREAD 12X150 (TROCAR) ×1 IMPLANT
TROCAR Z-THREAD BLADED 12X100M (TROCAR) IMPLANT
TUBING FILTER THERMOFLATOR (ELECTROSURGICAL) ×3 IMPLANT

## 2011-08-10 NOTE — Preoperative (Signed)
Beta Blockers   Reason not to administer Beta Blockers:Not Applicable 

## 2011-08-10 NOTE — Anesthesia Postprocedure Evaluation (Signed)
  Anesthesia Post-op Note  Patient: Morgan Doyle  Procedure(s) Performed:  ROBOTIC ASSISTED TOTAL HYSTERECTOMY; CYSTOSCOPY  Patient Location: 319  Anesthesia Type: General  Level of Consciousness: awake, alert  and oriented  Airway and Oxygen Therapy: Patient Spontanous Breathing and Patient connected to nasal cannula oxygen  Post-op Pain: 5 /10  Post-op Assessment: Post-op Vital signs reviewed and Patient's Cardiovascular Status Stable  Post-op Vital Signs: Reviewed and stable  Complications: No apparent anesthesia complications

## 2011-08-10 NOTE — Anesthesia Postprocedure Evaluation (Signed)
  Anesthesia Post-op Note  Patient: Morgan Doyle  Procedure(s) Performed:  ROBOTIC ASSISTED TOTAL HYSTERECTOMY; CYSTOSCOPY Patient is awake and responsive. Pain and nausea are reasonably well controlled. Vital signs are stable and clinically acceptable. Oxygen saturation is clinically acceptable. There are no apparent anesthetic complications at this time. Patient is ready for discharge.

## 2011-08-10 NOTE — Progress Notes (Signed)
Encounter addended by: Karleen Dolphin on: 08/10/2011  4:48 PM<BR>     Documentation filed: Notes Section

## 2011-08-10 NOTE — Brief Op Note (Signed)
08/10/2011  11:26 AM  PATIENT:  Morgan Doyle  45 y.o. female  PRE-OPERATIVE DIAGNOSIS:  Symptomatic Fibroids;Menorrhagia  POST-OPERATIVE DIAGNOSIS:  Symptomatic Fibroids;Menorrhagia  PROCEDURE:  Procedure(s): ROBOTIC ASSISTED TOTAL HYSTERECTOMY CYSTOSCOPY  SURGEON:  Surgeon(s): Elray Mcgregor  PHYSICIAN ASSISTANT:   ASSISTANTS: Dr. Greta Doom   ANESTHESIA:   general  ESTIMATED BLOOD LOSS: 150 cc  IVF:  3000 cc  UOP:  300 cc   BLOOD ADMINISTERED:none  DRAINS: none   LOCAL MEDICATIONS USED:  NONE  SPECIMEN:  Source of Specimen:  uterus and cervix  DISPOSITION OF SPECIMEN:  PATHOLOGY  COUNTS:  YES  PATIENT DISPOSITION:  PACU - hemodynamically stable.   Delay start of Pharmacological VTE agent (>24hrs) due to surgical blood loss or risk of bleeding:  not applicable  Preoperative Diagnosis:  Symptomatic fibroid uterus.  Postoperative Diagnosis:  Same.  Procedure:  Robotic Assisted Supracervical Hysterectomy with bilateral salpingectomies and Left oophorectomy.  Attending: Loney Laurence  Assistant: Leilani Able, PA  Anesthesia:  General endotracheal.  Estimated blood loss:  200 cc.  Complications:  None.  Findings:  17 weeks bulky uterus with multiple fibroids.  Ovaries were normal bilaterally.  The ureters were identified during multiple points of the case and were always out of the field of dissection.  On cystoscopy, the bladder was intact and bilateral spill was seen from each ureteral orriface.    Medications:  Ancef.  Indigo carmine.    Technique:  After adequate anesthesia was achieved the patient was positioned, prepped and draped in usual sterile fashion.  A speculum was placed in the vagina and the cervix dilated with pratt dilators.  The 12 cm Rumi and 3 cm Koh ring were assembled and placed in proper fashion.  The  Speculum was removed and the bladder catheterized with a foley.    Attention was turned to the abdomen  where a 1 cm incision was made 5 cm above the umbilicus.  The fascia was tented up and incised with a scalpel.  Each angle was secured with a stitch of 2-vicryl and the peritoneum entered into bluntly with my finger.  The long 12 mm trocar was placed and the other four trocar sites were marked out, all approximately 10 cm from each other and the camera.  Three 8.5 mm trocars were placed on either side of the camera port and 3 cm above the iliac crest on the right side.  A 5 mm assistant port was placed opposite the left lower trocar.  All trocars were inserted under direct visualization of the camera.  The patient was placed in trendelenburg and then the Robot docked.  The PK forceps were placed on arm 2, the Prograsp on arm 3 and the Hot shears on arm 1 and introduced under direct visualization of the camera  I then broke scrub and sat down at the console.  The above findings were noted and the ureters identified well out of the field of dissection.  The right fallopian tube was isolated and cauterized with the PK.  The Utero-ovarian ligament was then divided with the PK cautery and shears.  The posterior broad ligament was then divided with the hot shears until the uterosacral ligament.  The Broad and cardinal ligaments were then cauterized against the cervix to the level of the Koh ring, securing the uterine artery.  Each pedicle was then incised with the shears.  The anterior leaf was then incised at the reflection of the vessico-uterine junction and the  lateral bladder retracted inferiorly after the round ligament had been divided with the PK forceps.  The left tube was cauterized with the PK and divided with the shears;  then the left utero-ovarian ligament divided with the PK forceps and the scissors.  The round ligament was divided as well and the posterior leaf of the broad ligament then divided with the hot shears. The broad and cardinal ligaments were then cauterized on the left in the same way.   At  the level of the internal os, the uterine arteries were bilaterally cauterized with the PK.  The ureters were identified well out of the field of dissection.     The bladder was then able to be retracted inferiorly and the vesico-uterine fascia was incised in the midline until the bladder was removed one cm below the Koh ring.  The hot shears then circumferentially incised the vagina at the level of the reflection on the [facility] ring.  Once the uterus and cervix were amputated, cautery was used to insure hemostasis of the cuff.  Once hemostasis was achieved, the instruments were changed to the long forceps and mega suture cut needle driver and the cuff was closed with two running stitches of 0-vicryl.  An additional figure of eight stitch was used to close a area on the left side more securely.  The Robot was then undocked and I scrubbed back in.  The 5 mm scope was placed and the 15 cm Storz Morcellator was introduced into the camera port site.  Careful morcellation was performed and all visible debris removed. Irrigation was performed and all instruments removed after the abdomen had been carefully desufflated.  The 15 cm camera/morcellator port was closed with a running stitch of 2-0 vicryl.  The skin incisions were closed with subcuticular stitches of 3-0 vicryl Rapide and Dermabond.  All instruments were removed from the vagina and cystoscopy performed, revealing an intact bladder and spill of indigo carmine from each ureteral orifice.  The cystoscope was removed and the patient taken to the recovery room in stable condition.

## 2011-08-10 NOTE — Progress Notes (Signed)
There has been no change in the patients history or status since the history and physical.  Filed Vitals:   08/10/11 0610  BP: 123/92  Pulse: 81  Temp: 98.4 F (36.9 C)  TempSrc: Oral  Resp: 18  SpO2: 98%    Lab Results  Component Value Date   WBC 4.0 08/05/2011   HGB 14.3 08/05/2011   HCT 44.5 08/05/2011   MCV 73.3* 08/05/2011   PLT 255 08/05/2011    HORVATH,MICHELLE A

## 2011-08-10 NOTE — Transfer of Care (Signed)
Patient care transferred to Relief CRNA 

## 2011-08-10 NOTE — Transfer of Care (Signed)
  Anesthesia Post-op Note  Patient: Morgan Doyle  Procedure(s) Performed:  ROBOTIC ASSISTED TOTAL HYSTERECTOMY; CYSTOSCOPY  Patient Location: PACU  Anesthesia Type: General  Level of Consciousness: awake, alert  and sedated  Airway and Oxygen Therapy: Patient Spontanous Breathing and Patient connected to nasal cannula oxygen  Post-op Pain: none  Post-op Assessment: Post-op Vital signs reviewed and Patient's Cardiovascular Status Stable  Post-op Vital Signs: Reviewed and stable  Complications: No apparent anesthesia complications

## 2011-08-10 NOTE — Progress Notes (Signed)
Patient is eating, ambulating, not voiding yet.  Pain control is good.  BP 129/87  Pulse 69  Temp(Src) 98 F (36.7 C) (Oral)  Resp 16  SpO2 100%  lungs:   clear to auscultation cor:    RRR Abdomen:  soft, appropriate tenderness, incisions intact and without erythema or exudate. ex:    no cords   Lab Results  Component Value Date   WBC 4.0 08/05/2011   HGB 14.3 08/05/2011   HCT 44.5 08/05/2011   MCV 73.3* 08/05/2011   PLT 255 08/05/2011    A/P  Routine care.  Expect d/c per plan.

## 2011-08-10 NOTE — Op Note (Addendum)
08/10/2011  11:26 AM  PATIENT:  Morgan Doyle  45 y.o. female  PRE-OPERATIVE DIAGNOSIS:  Symptomatic Fibroids;Menorrhagia  POST-OPERATIVE DIAGNOSIS:  Symptomatic Fibroids;Menorrhagia  PROCEDURE:  Procedure(s): ROBOTIC ASSISTED TOTAL HYSTERECTOMY CYSTOSCOPY  SURGEON:  Surgeon(s): Elray Mcgregor  PHYSICIAN ASSISTANT:   ASSISTANTS: Dr. Greta Doom   ANESTHESIA:   general  ESTIMATED BLOOD LOSS: 150 cc  IVF:  3000 cc  UOP:  300 cc   BLOOD ADMINISTERED:none  DRAINS: none   LOCAL MEDICATIONS USED:  NONE  SPECIMEN:  Source of Specimen:  uterus and cervix  DISPOSITION OF SPECIMEN:  PATHOLOGY  COUNTS:  YES  PATIENT DISPOSITION:  PACU - hemodynamically stable.   Delay start of Pharmacological VTE agent (>24hrs) due to surgical blood loss or risk of bleeding:  not applicable  Preoperative Diagnosis:  Symptomatic fibroid uterus.  Postoperative Diagnosis:  Same.  Procedure:  Robotic Assisted Supracervical Hysterectomy with bilateral salpingectomies and Left oophorectomy.  Attending: Loney Laurence  Assistant: Leilani Able, PA  Anesthesia:  General endotracheal.  Estimated blood loss:  200 cc.  Complications:  None.  Findings:  17 weeks bulky uterus with multiple fibroids.  Ovaries were normal bilaterally.  The ureters were identified during multiple points of the case and were always out of the field of dissection.  On cystoscopy, the bladder was intact and bilateral spill was seen from each ureteral orriface.    Medications:  Ancef.  Indigo carmine.    Technique:  After adequate anesthesia was achieved the patient was positioned, prepped and draped in usual sterile fashion.  A speculum was placed in the vagina and the cervix dilated with pratt dilators.  The 12 cm Rumi and 3 cm Koh ring were assembled and placed in proper fashion.  The  Speculum was removed and the bladder catheterized with a foley.    Attention was turned to the abdomen  where a 1 cm incision was made 5 cm above the umbilicus.  The fascia was tented up and incised with a scalpel.  Each angle was secured with a stitch of 2-vicryl and the peritoneum entered into bluntly with my finger.  The long 12 mm trocar was placed and the other four trocar sites were marked out, all approximately 10 cm from each other and the camera.  Three 8.5 mm trocars were placed on either side of the camera port and 3 cm above the iliac crest on the right side.  A 5 mm assistant port was placed opposite the left lower trocar.  All trocars were inserted under direct visualization of the camera.  The patient was placed in trendelenburg and then the Robot docked.  The PK forceps were placed on arm 2, the Prograsp on arm 3 and the Hot shears on arm 1 and introduced under direct visualization of the camera  I then broke scrub and sat down at the console.  The above findings were noted and the ureters identified well out of the field of dissection.  The right fallopian tube was isolated and cauterized with the PK.  The Utero-ovarian ligament was then divided with the PK cautery and shears.  The posterior broad ligament was then divided with the hot shears until the uterosacral ligament.  The Broad and cardinal ligaments were then cauterized against the cervix to the level of the Koh ring, securing the uterine artery.  Each pedicle was then incised with the shears.  The anterior leaf was then incised at the reflection of the vessico-uterine junction and the  lateral bladder retracted inferiorly after the round ligament had been divided with the PK forceps.  The left tube was cauterized with the PK and divided with the shears;  then the left utero-ovarian ligament divided with the PK forceps and the scissors.  The round ligament was divided as well and the posterior leaf of the broad ligament then divided with the hot shears. The broad and cardinal ligaments were then cauterized on the left in the same way.   At  the level of the internal os, the uterine arteries were bilaterally cauterized with the PK.  The ureters were identified well out of the field of dissection.     The bladder was then able to be retracted inferiorly and the vesico-uterine fascia was incised in the midline until the bladder was removed one cm below the Koh ring.  The hot shears then circumferentially incised the vagina at the level of the reflection on the [facility] ring.  Once the uterus and cervix were amputated, cautery was used to insure hemostasis of the cuff.  Once hemostasis was achieved, the instruments were changed to the long forceps and mega suture cut needle driver and the cuff was closed with two running stitches of 0-vicryl.  An additional figure of eight stitch was used to close a area on the left side more securely.  The Robot was then undocked and I scrubbed back in.  The 5 mm scope was placed and the 15 cm Storz Morcellator was introduced into the camera port site.  Careful morcellation was performed and all visible debris removed. Irrigation was performed and all instruments removed after the abdomen had been carefully desufflated.  The 15 cm camera/morcellator port was closed with a running stitch of 2-0 vicryl.  The skin incisions were closed with subcuticular stitches of 3-0 vicryl Rapide and Dermabond.  All instruments were removed from the vagina and cystoscopy performed, revealing an intact bladder and spill of indigo carmine from each ureteral orifice.  The cystoscope was removed and the patient taken to the recovery room in stable condition.  HORVATH,MICHELLE A

## 2011-08-11 LAB — CBC
Hemoglobin: 11.3 g/dL — ABNORMAL LOW (ref 12.0–15.0)
MCHC: 32 g/dL (ref 30.0–36.0)
Platelets: 224 10*3/uL (ref 150–400)
RBC: 4.86 MIL/uL (ref 3.87–5.11)

## 2011-08-11 MED ORDER — OXYCODONE-ACETAMINOPHEN 5-325 MG PO TABS: 1.0000 | ORAL_TABLET | ORAL | Status: AC | PRN

## 2011-08-11 NOTE — Progress Notes (Signed)
Patient is eating, ambulating,  voiding.  Pain control is good.  BP 101/62  Pulse 66  Temp(Src) 98.4 F (36.9 C) (Oral)  Resp 18  Ht 5\' 6"  (1.676 m)  Wt 83.462 kg (184 lb)  BMI 29.70 kg/m2  SpO2 99%  lungs:   clear to auscultation cor:    RRR Abdomen:  soft, appropriate tenderness, incisions intact and without erythema or exudate. ex:    no cords   Lab Results  Component Value Date   WBC 10.2 08/11/2011   HGB 11.3* 08/11/2011   HCT 35.3* 08/11/2011   MCV 72.6* 08/11/2011   PLT 224 08/11/2011    A/P  Routine care.  Expect d/c per plan.

## 2011-08-11 NOTE — Progress Notes (Signed)
Pt discharged to home with husband and daughter.  Condition stable.  Pt to car via wheelchair with NT. No equipment at discharge.

## 2011-08-11 NOTE — Discharge Summary (Signed)
Physician Discharge Summary  Patient ID: Morgan Doyle MRN: [id number] DOB/AGE: 17-Aug-1966 45 y.o.  Admit date: 08/10/2011 Discharge date: 08/11/2011  Admission Diagnoses: Symptomatic fibroid uterus.  Discharge Diagnoses: Same  Procedures:  Robotic TLH.  Discharged Condition: good  Hospital Course:  Pt underwent above procedure without complication and was d/ced on POD 1 eating, ambulating and voiding.  Consults: none  Significant Diagnostic Studies: labs:  Lab Results  Component Value Date   WBC 10.2 08/11/2011   HGB 11.3* 08/11/2011   HCT 35.3* 08/11/2011   MCV 72.6* 08/11/2011   PLT 224 08/11/2011    Treatments: surgery: Robotic TLH  Discharge Exam: Blood pressure 101/62, pulse 66, temperature 98.4 F (36.9 C), temperature source Oral, resp. rate 18, height 5\' 6"  (1.676 m), weight 83.462 kg (184 lb), SpO2 99.00%. see progress notes  Disposition: to home.  Discharge Orders    Future Orders Please Complete By Expires   Diet - low sodium heart healthy      Discharge instructions      Comments:   No driving on narcotics, no sexual activity for 2 weeks.   Increase activity slowly      May shower / Bathe      Comments:   Shower, no bath for 2 weeks.   Sexual Activity Restrictions      Comments:   No sexual activity for 2 weeks.   Remove dressing in 24 hours      Call MD for:  temperature >100.4        Current Discharge Medication List    START taking these medications   Details  oxyCODONE-acetaminophen (ROXICET) 5-325 MG per tablet Take 1 tablet by mouth every 4 (four) hours as needed for pain. Qty: 30 tablet, Refills: 0      CONTINUE these medications which have NOT CHANGED   Details  ibuprofen (ADVIL,MOTRIN) 200 MG tablet Take 200 mg by mouth as needed. Usually takes 2-3 tablets as needed for pain headache.     triamterene-hydrochlorothiazide (DYAZIDE) 37.5-25 MG per capsule Take 1 capsule by mouth every morning.      fexofenadine (ALLEGRA) 180 MG  tablet Take 180 mg by mouth once as needed.        STOP taking these medications     aspirin-acetaminophen-caffeine (EXCEDRIN MIGRAINE) 250-250-65 MG per tablet      Leuprolide Acetate (LUPRON DEPOT IM)          Signed: HORVATH,Morissa Obeirne A 08/11/2011, 7:44 AM

## 2011-08-17 NOTE — Progress Notes (Signed)
On 08/10/11, 1405 pt. Had bilateral sequential compression devices on for VTE prophylaxis.

## 2011-08-23 ENCOUNTER — Encounter (HOSPITAL_COMMUNITY): Payer: Self-pay | Admitting: Obstetrics and Gynecology

## 2011-11-01 ENCOUNTER — Telehealth: Payer: Self-pay | Admitting: Family Medicine

## 2011-11-01 NOTE — Telephone Encounter (Signed)
Pt need cpx before 12-12-11. Can I  Create 30 min slot?

## 2011-11-02 NOTE — Telephone Encounter (Signed)
Okay to schedule

## 2011-11-04 NOTE — Telephone Encounter (Signed)
lmom for pt call back.

## 2011-11-04 NOTE — Telephone Encounter (Signed)
cpx sch for 12-05-11 8.30am

## 2011-11-25 ENCOUNTER — Other Ambulatory Visit (INDEPENDENT_AMBULATORY_CARE_PROVIDER_SITE_OTHER): Payer: BC Managed Care – PPO

## 2011-11-25 DIAGNOSIS — Z0000 Encounter for general adult medical examination without abnormal findings: Secondary | ICD-10-CM

## 2011-11-25 LAB — BASIC METABOLIC PANEL
BUN: 16 mg/dL (ref 6–23)
CO2: 30 mEq/L (ref 19–32)
Chloride: 105 mEq/L (ref 96–112)
GFR: 77.23 mL/min (ref >60.00)
Potassium: 3.7 mEq/L (ref 3.5–5.1)
Sodium: 141 mEq/L (ref 135–145)

## 2011-11-25 LAB — POCT URINALYSIS DIPSTICK
Bilirubin, UA: NEGATIVE
Blood, UA: NEGATIVE
Glucose, UA: NEGATIVE
Leukocytes, UA: NEGATIVE
Nitrite, UA: NEGATIVE
Urobilinogen, UA: 0.2
pH, UA: 7

## 2011-11-25 LAB — LIPID PANEL
Cholesterol: 221 mg/dL — ABNORMAL HIGH (ref 0–200)
Total CHOL/HDL Ratio: 4
Triglycerides: 56 mg/dL (ref 0.0–149.0)
VLDL: 11.2 mg/dL (ref 0.0–40.0)

## 2011-11-25 LAB — CBC WITH DIFFERENTIAL/PLATELET
Basophils Relative: 0.3 % (ref 0.0–3.0)
Eosinophils Relative: 1.3 % (ref 0.0–5.0)
HCT: 43 % (ref 36.0–46.0)
Lymphs Abs: 2.1 10*3/uL (ref 0.7–4.0)
MCV: 81.9 fl (ref 78.0–100.0)
Monocytes Absolute: 0.3 10*3/uL (ref 0.1–1.0)
Monocytes Relative: 6.6 % (ref 3.0–12.0)
Platelets: 265 10*3/uL (ref 150.0–400.0)
RBC: 5.25 Mil/uL — ABNORMAL HIGH (ref 3.87–5.11)
WBC: 5 10*3/uL (ref 4.5–10.5)

## 2011-11-25 LAB — LDL CHOLESTEROL, DIRECT: Direct LDL: 156.6 mg/dL

## 2011-11-25 LAB — HEPATIC FUNCTION PANEL
ALT: 17 U/L (ref 0–35)
AST: 20 U/L (ref 0–37)
Bilirubin, Direct: 0 mg/dL (ref 0.0–0.3)
Total Bilirubin: 0.3 mg/dL (ref 0.3–1.2)
Total Protein: 7.5 g/dL (ref 6.0–8.3)

## 2011-11-25 LAB — TSH: TSH: 0.78 u[IU]/mL (ref 0.35–5.50)

## 2011-12-01 ENCOUNTER — Encounter: Payer: Self-pay | Admitting: Family Medicine

## 2011-12-01 NOTE — Progress Notes (Signed)
Quick Note:  Spoke with pt and put a copy in mail. ______ 

## 2011-12-05 ENCOUNTER — Encounter: Payer: Self-pay | Admitting: Family Medicine

## 2011-12-05 ENCOUNTER — Ambulatory Visit (INDEPENDENT_AMBULATORY_CARE_PROVIDER_SITE_OTHER): Payer: BC Managed Care – PPO | Admitting: Family Medicine

## 2011-12-05 VITALS — BP 124/84 | HR 76 | Temp 98.0°F | Ht 67.5 in | Wt 195.0 lb

## 2011-12-05 DIAGNOSIS — Z0000 Encounter for general adult medical examination without abnormal findings: Secondary | ICD-10-CM

## 2011-12-05 MED ORDER — TRIAMTERENE-HCTZ 37.5-25 MG PO CAPS: 1.0000 | ORAL_CAPSULE | ORAL | Status: DC

## 2011-12-05 NOTE — Progress Notes (Signed)
  Subjective:    Patient ID: Morgan Doyle, female    DOB: 11-29-1966, 45 y.o.   MRN: [id number]  HPI 45 yr old female for a cpx. She feels well and has no concerns.    Review of Systems  Constitutional: Negative.  Negative for fever, diaphoresis, activity change, appetite change, fatigue and unexpected weight change.  HENT: Negative.  Negative for hearing loss, ear pain, nosebleeds, congestion, sore throat, trouble swallowing, neck pain, neck stiffness, voice change and tinnitus.   Eyes: Negative.  Negative for photophobia, pain, discharge, redness and visual disturbance.  Respiratory: Negative.  Negative for apnea, cough, choking, chest tightness, shortness of breath, wheezing and stridor.   Cardiovascular: Negative.  Negative for chest pain, palpitations and leg swelling.  Gastrointestinal: Negative.  Negative for nausea, vomiting, abdominal pain, diarrhea, constipation, blood in stool, abdominal distention and rectal pain.  Genitourinary: Negative.  Negative for dysuria, urgency, frequency, hematuria, flank pain, decreased urine volume, vaginal bleeding, vaginal discharge, enuresis, difficulty urinating, vaginal pain, menstrual problem, pelvic pain and dyspareunia.  Musculoskeletal: Negative.  Negative for myalgias, back pain, joint swelling, arthralgias and gait problem.  Skin: Negative.  Negative for color change, pallor, rash and wound.  Neurological: Negative.  Negative for dizziness, tremors, seizures, syncope, speech difficulty, weakness, light-headedness, numbness and headaches.  Hematological: Negative.  Negative for adenopathy. Does not bruise/bleed easily.  Psychiatric/Behavioral: Negative.  Negative for hallucinations, behavioral problems, confusion, sleep disturbance, dysphoric mood and agitation. The patient is not nervous/anxious.        Objective:   Physical Exam  Constitutional: She is oriented to person, place, and time. She appears well-developed and well-nourished. No  distress.  HENT:  Head: Normocephalic and atraumatic.  Right Ear: External ear normal.  Left Ear: External ear normal.  Nose: Nose normal.  Mouth/Throat: Oropharynx is clear and moist. No oropharyngeal exudate.  Eyes: Conjunctivae and EOM are normal. Pupils are equal, round, and reactive to light. No scleral icterus.  Neck: Normal range of motion. Neck supple. No JVD present. No thyromegaly present.  Cardiovascular: Normal rate, regular rhythm, normal heart sounds and intact distal pulses.  Exam reveals no gallop and no friction rub.   No murmur heard. Pulmonary/Chest: Effort normal and breath sounds normal. No respiratory distress. She has no wheezes. She has no rales. She exhibits no tenderness.  Abdominal: Soft. Bowel sounds are normal. She exhibits no distension and no mass. There is no tenderness. There is no rebound and no guarding.  Musculoskeletal: Normal range of motion. She exhibits no edema and no tenderness.  Lymphadenopathy:    She has no cervical adenopathy.  Neurological: She is alert and oriented to person, place, and time. She has normal reflexes. No cranial nerve deficit. She exhibits normal muscle tone. Coordination normal.  Skin: Skin is warm and dry. No rash noted. No erythema.  Psychiatric: She has a normal mood and affect. Her behavior is normal. Judgment and thought content normal.          Assessment & Plan:  We discussed her diet and her need to exercise.

## 2012-02-20 ENCOUNTER — Ambulatory Visit: Payer: BC Managed Care – PPO | Admitting: Family Medicine

## 2012-02-21 ENCOUNTER — Ambulatory Visit: Payer: BC Managed Care – PPO | Admitting: Family Medicine

## 2012-05-04 ENCOUNTER — Ambulatory Visit (INDEPENDENT_AMBULATORY_CARE_PROVIDER_SITE_OTHER): Payer: BC Managed Care – PPO | Admitting: Family Medicine

## 2012-05-04 ENCOUNTER — Encounter: Payer: Self-pay | Admitting: Family Medicine

## 2012-05-04 VITALS — BP 132/78 | HR 101 | Temp 99.0°F | Wt 182.0 lb

## 2012-05-04 DIAGNOSIS — B9789 Other viral agents as the cause of diseases classified elsewhere: Secondary | ICD-10-CM

## 2012-05-04 DIAGNOSIS — B349 Viral infection, unspecified: Secondary | ICD-10-CM

## 2012-05-04 LAB — HEPATIC FUNCTION PANEL
AST: 14 U/L (ref 0–37)
Albumin: 3.7 g/dL (ref 3.5–5.2)
Alkaline Phosphatase: 80 U/L (ref 39–117)
Bilirubin, Direct: 0 mg/dL (ref 0.0–0.3)
Total Bilirubin: 0.4 mg/dL (ref 0.3–1.2)

## 2012-05-04 LAB — CBC WITH DIFFERENTIAL/PLATELET
Basophils Relative: 0.3 % (ref 0.0–3.0)
Eosinophils Relative: 0.6 % (ref 0.0–5.0)
Hemoglobin: 13.7 g/dL (ref 12.0–15.0)
MCV: 81.4 fl (ref 78.0–100.0)
Monocytes Absolute: 0.6 10*3/uL (ref 0.1–1.0)
Neutro Abs: 4.2 10*3/uL (ref 1.4–7.7)
Neutrophils Relative %: 56.5 % (ref 43.0–77.0)
RBC: 5.29 Mil/uL — ABNORMAL HIGH (ref 3.87–5.11)
WBC: 7.5 10*3/uL (ref 4.5–10.5)

## 2012-05-04 LAB — POCT URINALYSIS DIPSTICK
Bilirubin, UA: NEGATIVE
Blood, UA: NEGATIVE
Glucose, UA: NEGATIVE
Ketones, UA: NEGATIVE
Nitrite, UA: NEGATIVE
Spec Grav, UA: 1.015

## 2012-05-04 LAB — BASIC METABOLIC PANEL
Calcium: 10 mg/dL (ref 8.4–10.5)
GFR: 87.16 mL/min (ref >60.00)
Potassium: 3.8 mEq/L (ref 3.5–5.1)
Sodium: 141 mEq/L (ref 135–145)

## 2012-05-04 MED ORDER — METHYLPREDNISOLONE ACETATE 80 MG/ML IJ SUSP
120.0000 mg | Freq: Once | INTRAMUSCULAR | Status: AC
  Administered 2012-05-04: 120 mg via INTRAMUSCULAR

## 2012-05-04 NOTE — Progress Notes (Signed)
Addended by: Aniceto Boss A on: 05/04/2012 04:28 PM   Modules accepted: Orders

## 2012-05-04 NOTE — Progress Notes (Signed)
  Subjective:    Patient ID: Morgan Doyle, female    DOB: Jun 14, 1966, 46 y.o.   MRN: [id number]  HPI Here for 2 weeks of fevers to 99.8 degrees, fatigue, and body aches. It started out with a ST which resolved after a few days. No cough, no NVD. No rashes. No recent tick bites. She has had dull HA. She gets relief with Tylenol.    Review of Systems  Constitutional: Positive for fever, chills and fatigue.  Eyes: Negative.   Respiratory: Negative.   Gastrointestinal: Negative.   Genitourinary: Negative.   Musculoskeletal: Positive for myalgias.  Neurological: Positive for headaches.       Objective:   Physical Exam  Constitutional: She appears well-developed and well-nourished.  HENT:  Right Ear: External ear normal.  Left Ear: External ear normal.  Nose: Nose normal.  Mouth/Throat: Oropharynx is clear and moist. No oropharyngeal exudate.  Eyes: Conjunctivae are normal.  Neck: Neck supple. No thyromegaly present.  Cardiovascular: Normal rate, regular rhythm, normal heart sounds and intact distal pulses.   Pulmonary/Chest: Effort normal and breath sounds normal.  Abdominal: Soft. She exhibits no distension and no mass. There is no tenderness. There is no rebound and no guarding.  Lymphadenopathy:    She has no cervical adenopathy.          Assessment & Plan:  This is consistent with a viral illness, possibly mononucleosis. We will get labs today. Rest, drink fluids, use Advil prn

## 2012-05-06 LAB — EPSTEIN-BARR VIRUS VCA, IGM: EBV VCA IgM: 0.1 U/mL

## 2012-05-09 NOTE — Progress Notes (Signed)
Quick Note:  I spoke with pt and she is going to schedule a follow up to discuss these results. ______

## 2012-05-11 ENCOUNTER — Encounter: Payer: Self-pay | Admitting: Family Medicine

## 2012-05-11 ENCOUNTER — Ambulatory Visit (INDEPENDENT_AMBULATORY_CARE_PROVIDER_SITE_OTHER): Payer: BC Managed Care – PPO | Admitting: Family Medicine

## 2012-05-11 VITALS — BP 134/88 | HR 96 | Temp 98.7°F | Wt 183.0 lb

## 2012-05-11 DIAGNOSIS — B2790 Infectious mononucleosis, unspecified without complication: Secondary | ICD-10-CM

## 2012-05-11 NOTE — Progress Notes (Signed)
  Subjective:    Patient ID: Morgan Doyle, female    DOB: [birth year], 46 y.o.   MRN: [id number]  HPI Here to follow up on mononucleosis. She began to have symptoms for this about 3 weeks ago, and now she is feeling much better. The body aches and fever have resolved. She still has some fatigue. She and her family have a lot of questions about this, about the natural history of mono, about being contagious, etc.    Review of Systems  Constitutional: Positive for fatigue.  HENT: Negative.   Eyes: Negative.   Respiratory: Negative.   Cardiovascular: Negative.   Gastrointestinal: Negative.        Objective:   Physical Exam  Constitutional: She appears well-developed and well-nourished.  Eyes: Conjunctivae are normal.  Neck: No thyromegaly present.  Pulmonary/Chest: Effort normal and breath sounds normal.  Abdominal: Soft. Bowel sounds are normal. She exhibits no distension and no mass. There is no tenderness. There is no rebound and no guarding.  Lymphadenopathy:    She has no cervical adenopathy.          Assessment & Plan:  She is getting over mononucleosis, and she feels much better. I explained that she is no longer contagious and that she may under any activities that she would like. Recheck prn

## 2012-07-10 ENCOUNTER — Other Ambulatory Visit: Payer: Self-pay | Admitting: Obstetrics and Gynecology

## 2012-07-11 ENCOUNTER — Other Ambulatory Visit: Payer: Self-pay | Admitting: Obstetrics and Gynecology

## 2012-07-11 DIAGNOSIS — N644 Mastodynia: Secondary | ICD-10-CM

## 2012-07-16 ENCOUNTER — Ambulatory Visit
Admission: RE | Admit: 2012-07-16 | Discharge: 2012-07-16 | Disposition: A | Discharge status: Home / Self Care | Payer: BC Managed Care – PPO | Source: Ambulatory Visit | Attending: Obstetrics and Gynecology | Admitting: Obstetrics and Gynecology

## 2012-07-16 DIAGNOSIS — N644 Mastodynia: Secondary | ICD-10-CM

## 2012-12-20 ENCOUNTER — Other Ambulatory Visit: Payer: Self-pay | Admitting: Family Medicine

## 2013-07-26 ENCOUNTER — Other Ambulatory Visit: Payer: Self-pay

## 2013-07-26 DIAGNOSIS — Z1231 Encounter for screening mammogram for malignant neoplasm of breast: Secondary | ICD-10-CM

## 2013-08-13 ENCOUNTER — Other Ambulatory Visit (INDEPENDENT_AMBULATORY_CARE_PROVIDER_SITE_OTHER): Payer: BC Managed Care – PPO

## 2013-08-13 DIAGNOSIS — Z0000 Encounter for general adult medical examination without abnormal findings: Secondary | ICD-10-CM

## 2013-08-13 LAB — POCT URINALYSIS DIPSTICK
Bilirubin, UA: NEGATIVE
Glucose, UA: NEGATIVE
Leukocytes, UA: NEGATIVE
Nitrite, UA: NEGATIVE
pH, UA: 7

## 2013-08-13 LAB — CBC WITH DIFFERENTIAL/PLATELET
Eosinophils Relative: 2.8 % (ref 0.0–5.0)
HCT: 43.8 % (ref 36.0–46.0)
Hemoglobin: 14.2 g/dL (ref 12.0–15.0)
Lymphs Abs: 1.9 10*3/uL (ref 0.7–4.0)
MCV: 80.7 fl (ref 78.0–100.0)
Monocytes Relative: 11 % (ref 3.0–12.0)
Neutro Abs: 1.6 10*3/uL (ref 1.4–7.7)
RDW: 15 % — ABNORMAL HIGH (ref 11.5–14.6)
WBC: 4 10*3/uL — ABNORMAL LOW (ref 4.5–10.5)

## 2013-08-13 LAB — BASIC METABOLIC PANEL
CO2: 29 mEq/L (ref 19–32)
Calcium: 8.8 mg/dL (ref 8.4–10.5)
Creatinine, Ser: 1 mg/dL (ref 0.4–1.2)
Glucose, Bld: 96 mg/dL (ref 70–99)

## 2013-08-13 LAB — HEPATIC FUNCTION PANEL
Bilirubin, Direct: 0 mg/dL (ref 0.0–0.3)
Total Bilirubin: 0.7 mg/dL (ref 0.3–1.2)
Total Protein: 7.4 g/dL (ref 6.0–8.3)

## 2013-08-13 LAB — LIPID PANEL: Total CHOL/HDL Ratio: 4

## 2013-08-14 NOTE — Progress Notes (Signed)
Quick Note:  I left a voice message with results. ______ 

## 2013-08-19 ENCOUNTER — Ambulatory Visit
Admission: RE | Admit: 2013-08-19 | Discharge: 2013-08-19 | Disposition: A | Discharge status: Home / Self Care | Payer: BC Managed Care – PPO | Source: Ambulatory Visit

## 2013-08-19 DIAGNOSIS — Z1231 Encounter for screening mammogram for malignant neoplasm of breast: Secondary | ICD-10-CM

## 2013-08-20 ENCOUNTER — Encounter: Payer: Self-pay | Admitting: Family Medicine

## 2013-08-20 ENCOUNTER — Ambulatory Visit (INDEPENDENT_AMBULATORY_CARE_PROVIDER_SITE_OTHER): Payer: BC Managed Care – PPO | Admitting: Family Medicine

## 2013-08-20 VITALS — BP 138/90 | HR 88 | Temp 98.4°F | Wt 188.0 lb

## 2013-08-20 DIAGNOSIS — Z0000 Encounter for general adult medical examination without abnormal findings: Secondary | ICD-10-CM

## 2013-08-20 MED ORDER — TRIAMTERENE-HCTZ 37.5-25 MG PO CAPS: ORAL_CAPSULE | ORAL | Status: DC

## 2013-08-20 NOTE — Progress Notes (Signed)
  Subjective:    Patient ID: CESIA ORF, female    DOB: 1966/06/27, 47 y.o.   MRN: [id number]  HPI 47 yr old female for a cpx. She feels well.    Review of Systems  Constitutional: Negative.   HENT: Negative.   Eyes: Negative.   Respiratory: Negative.   Cardiovascular: Negative.   Gastrointestinal: Negative.   Genitourinary: Negative for dysuria, urgency, frequency, hematuria, flank pain, decreased urine volume, enuresis, difficulty urinating, pelvic pain and dyspareunia.  Musculoskeletal: Negative.   Skin: Negative.   Neurological: Negative.   Psychiatric/Behavioral: Negative.        Objective:   Physical Exam  Constitutional: She is oriented to person, place, and time. She appears well-developed and well-nourished. No distress.  HENT:  Head: Normocephalic and atraumatic.  Right Ear: External ear normal.  Left Ear: External ear normal.  Nose: Nose normal.  Mouth/Throat: Oropharynx is clear and moist. No oropharyngeal exudate.  Eyes: Conjunctivae and EOM are normal. Pupils are equal, round, and reactive to light. No scleral icterus.  Neck: Normal range of motion. Neck supple. No JVD present. No thyromegaly present.  Cardiovascular: Normal rate, regular rhythm, normal heart sounds and intact distal pulses.  Exam reveals no gallop and no friction rub.   No murmur heard. Pulmonary/Chest: Effort normal and breath sounds normal. No respiratory distress. She has no wheezes. She has no rales. She exhibits no tenderness.  Abdominal: Soft. Bowel sounds are normal. She exhibits no distension and no mass. There is no tenderness. There is no rebound and no guarding.  Musculoskeletal: Normal range of motion. She exhibits no edema and no tenderness.  Lymphadenopathy:    She has no cervical adenopathy.  Neurological: She is alert and oriented to person, place, and time. She has normal reflexes. No cranial nerve deficit. She exhibits normal muscle tone. Coordination normal.  Skin: Skin  is warm and dry. No rash noted. No erythema.  Psychiatric: She has a normal mood and affect. Her behavior is normal. Judgment and thought content normal.          Assessment & Plan:  Well exam. We discussed diet and exercise.

## 2013-09-04 ENCOUNTER — Other Ambulatory Visit: Payer: Self-pay | Admitting: Family Medicine

## 2014-01-31 ENCOUNTER — Other Ambulatory Visit: Payer: Self-pay | Admitting: Family Medicine

## 2014-07-21 ENCOUNTER — Ambulatory Visit (INDEPENDENT_AMBULATORY_CARE_PROVIDER_SITE_OTHER): Payer: BC Managed Care – PPO | Admitting: Family Medicine

## 2014-07-21 ENCOUNTER — Encounter: Payer: Self-pay | Admitting: Family Medicine

## 2014-07-21 VITALS — BP 128/82 | HR 74 | Temp 99.0°F | Ht 67.5 in | Wt 185.0 lb

## 2014-07-21 DIAGNOSIS — M545 Low back pain, unspecified: Secondary | ICD-10-CM

## 2014-07-21 MED ORDER — PREDNISONE 10 MG PO TABS: ORAL_TABLET | ORAL | Status: DC

## 2014-07-21 MED ORDER — CYCLOBENZAPRINE HCL 10 MG PO TABS: 10.0000 mg | ORAL_TABLET | Freq: Three times a day (TID) | ORAL | Status: DC | PRN

## 2014-07-21 MED ORDER — HYDROCODONE-ACETAMINOPHEN 10-325 MG PO TABS: 1.0000 | ORAL_TABLET | Freq: Four times a day (QID) | ORAL | Status: DC | PRN

## 2014-07-21 NOTE — Progress Notes (Signed)
   Subjective:    Patient ID: Morgan Doyle, female    DOB: 07/12/1966, 48 y.o.   MRN: [id number]  HPI Here with her daughter for severe low back pains which started 4 days ago. No recent trauma. No radiation to the legs. She has never had this before.    Review of Systems  Constitutional: Negative.   Musculoskeletal: Positive for back pain.       Objective:   Physical Exam  Constitutional: She appears well-developed and well-nourished.  In pain, walks with difficulty           Assessment & Plan:  Use heat and the meds prescribed. Rest. Written out of work today until 07-26-14.

## 2014-07-21 NOTE — Progress Notes (Signed)
Pre visit review using our clinic review tool, if applicable. No additional management support is needed unless otherwise documented below in the visit note. 

## 2014-08-07 ENCOUNTER — Telehealth: Payer: Self-pay | Admitting: Family Medicine

## 2014-08-07 DIAGNOSIS — M545 Low back pain: Secondary | ICD-10-CM

## 2014-08-07 DIAGNOSIS — M25559 Pain in unspecified hip: Secondary | ICD-10-CM

## 2014-08-07 NOTE — Telephone Encounter (Signed)
Requesting to speak with you or dr. Sarajane Jews concerning her symptoms from her last visit.

## 2014-08-07 NOTE — Telephone Encounter (Signed)
I spoke with pt and she is still having pain in low back, right hip pain and left leg. She is using heat and taking all of the medications that you prescribed on 07/21/14. She wants to know what would be the next step, since nothing seems to be helping with the pain. Does she need xrays, therapy or a referral?

## 2014-08-07 NOTE — Telephone Encounter (Signed)
I spoke with pt and she will get this done ASAP.

## 2014-08-07 NOTE — Telephone Encounter (Signed)
I ordered Xrays of her lower back and both hips

## 2014-08-08 ENCOUNTER — Ambulatory Visit (INDEPENDENT_AMBULATORY_CARE_PROVIDER_SITE_OTHER)
Admission: RE | Admit: 2014-08-08 | Discharge: 2014-08-08 | Disposition: A | Discharge status: Home / Self Care | Payer: BC Managed Care – PPO | Source: Ambulatory Visit | Attending: Family Medicine | Admitting: Family Medicine

## 2014-08-08 DIAGNOSIS — M545 Low back pain, unspecified: Secondary | ICD-10-CM

## 2014-08-08 DIAGNOSIS — M25559 Pain in unspecified hip: Secondary | ICD-10-CM

## 2014-08-14 ENCOUNTER — Other Ambulatory Visit: Payer: BC Managed Care – PPO

## 2014-08-15 ENCOUNTER — Other Ambulatory Visit (INDEPENDENT_AMBULATORY_CARE_PROVIDER_SITE_OTHER): Payer: BC Managed Care – PPO

## 2014-08-15 DIAGNOSIS — Z0000 Encounter for general adult medical examination without abnormal findings: Secondary | ICD-10-CM

## 2014-08-15 LAB — CBC WITH DIFFERENTIAL/PLATELET
BASOS ABS: 0 10*3/uL (ref 0.0–0.1)
Basophils Relative: 0.5 % (ref 0.0–3.0)
EOS ABS: 0.1 10*3/uL (ref 0.0–0.7)
Eosinophils Relative: 2.3 % (ref 0.0–5.0)
HCT: 43.5 % (ref 36.0–46.0)
HEMOGLOBIN: 14.3 g/dL (ref 12.0–15.0)
LYMPHS PCT: 51.2 % — AB (ref 12.0–46.0)
Lymphs Abs: 2.2 10*3/uL (ref 0.7–4.0)
MCHC: 32.8 g/dL (ref 30.0–36.0)
MCV: 79.7 fl (ref 78.0–100.0)
MONOS PCT: 9.8 % (ref 3.0–12.0)
Monocytes Absolute: 0.4 10*3/uL (ref 0.1–1.0)
NEUTROS PCT: 36.2 % — AB (ref 43.0–77.0)
Neutro Abs: 1.6 10*3/uL (ref 1.4–7.7)
Platelets: 272 10*3/uL (ref 150.0–400.0)
RBC: 5.46 Mil/uL — AB (ref 3.87–5.11)
RDW: 15.6 % — AB (ref 11.5–15.5)
WBC: 4.3 10*3/uL (ref 4.0–10.5)

## 2014-08-15 LAB — BASIC METABOLIC PANEL
BUN: 11 mg/dL (ref 6–23)
CALCIUM: 9.6 mg/dL (ref 8.4–10.5)
CO2: 29 meq/L (ref 19–32)
CREATININE: 1.1 mg/dL (ref 0.4–1.2)
Chloride: 103 mEq/L (ref 96–112)
GFR: 70.56 mL/min (ref >60.00)
GLUCOSE: 84 mg/dL (ref 70–99)
Potassium: 3.7 mEq/L (ref 3.5–5.1)
SODIUM: 138 meq/L (ref 135–145)

## 2014-08-15 LAB — POCT URINALYSIS DIPSTICK
BILIRUBIN UA: NEGATIVE
GLUCOSE UA: NEGATIVE
KETONES UA: NEGATIVE
Leukocytes, UA: NEGATIVE
Nitrite, UA: NEGATIVE
Protein, UA: NEGATIVE
RBC UA: NEGATIVE
Urobilinogen, UA: 0.2
pH, UA: 5.5

## 2014-08-15 LAB — HEPATIC FUNCTION PANEL
ALK PHOS: 52 U/L (ref 39–117)
ALT: 17 U/L (ref 0–35)
AST: 17 U/L (ref 0–37)
Albumin: 3.7 g/dL (ref 3.5–5.2)
BILIRUBIN DIRECT: 0 mg/dL (ref 0.0–0.3)
Total Bilirubin: 0.6 mg/dL (ref 0.2–1.2)
Total Protein: 7.4 g/dL (ref 6.0–8.3)

## 2014-08-15 LAB — LIPID PANEL
CHOLESTEROL: 195 mg/dL (ref 0–200)
HDL: 44.9 mg/dL (ref >39.00)
LDL Cholesterol: 141 mg/dL — ABNORMAL HIGH (ref 0–99)
NONHDL: 150.1
Total CHOL/HDL Ratio: 4
Triglycerides: 45 mg/dL (ref 0.0–149.0)
VLDL: 9 mg/dL (ref 0.0–40.0)

## 2014-08-15 LAB — TSH: TSH: 0.6 u[IU]/mL (ref 0.35–4.50)

## 2014-08-21 ENCOUNTER — Ambulatory Visit (INDEPENDENT_AMBULATORY_CARE_PROVIDER_SITE_OTHER): Payer: BC Managed Care – PPO | Admitting: Family Medicine

## 2014-08-21 ENCOUNTER — Encounter: Payer: Self-pay | Admitting: Family Medicine

## 2014-08-21 VITALS — BP 125/91 | HR 79 | Temp 98.4°F | Ht 67.5 in | Wt 180.0 lb

## 2014-08-21 DIAGNOSIS — Z0000 Encounter for general adult medical examination without abnormal findings: Secondary | ICD-10-CM

## 2014-08-21 MED ORDER — TRIAMTERENE-HCTZ 37.5-25 MG PO CAPS: ORAL_CAPSULE | ORAL | Status: DC

## 2014-08-21 NOTE — Progress Notes (Signed)
Pre visit review using our clinic review tool, if applicable. No additional management support is needed unless otherwise documented below in the visit note. 

## 2014-08-21 NOTE — Progress Notes (Signed)
   Subjective:    Patient ID: Morgan Doyle, female    DOB: 1966-04-06, 48 y.o.   MRN: [id number]  HPI 48 yr old female for a cpx. She feels well but asks for help with sleep. She has trouble both with falling asleep and with early morning awakenings. Benadryl does not help. She was seen here a few weeks ago for pain in the lower back, and this has responded well to treatment.    Review of Systems  Constitutional: Negative.   HENT: Negative.   Eyes: Negative.   Respiratory: Negative.   Cardiovascular: Negative.   Gastrointestinal: Negative.   Genitourinary: Negative for dysuria, urgency, frequency, hematuria, flank pain, decreased urine volume, enuresis, difficulty urinating, pelvic pain and dyspareunia.  Musculoskeletal: Negative.   Skin: Negative.   Neurological: Negative.   Psychiatric/Behavioral: Positive for sleep disturbance. Negative for suicidal ideas, hallucinations, behavioral problems, confusion, self-injury, dysphoric mood, decreased concentration and agitation. The patient is not nervous/anxious and is not hyperactive.        Objective:   Physical Exam  Constitutional: She is oriented to person, place, and time. She appears well-developed and well-nourished. No distress.  HENT:  Head: Normocephalic and atraumatic.  Right Ear: External ear normal.  Left Ear: External ear normal.  Nose: Nose normal.  Mouth/Throat: Oropharynx is clear and moist. No oropharyngeal exudate.  Eyes: Conjunctivae and EOM are normal. Pupils are equal, round, and reactive to light. No scleral icterus.  Neck: Normal range of motion. Neck supple. No JVD present. No thyromegaly present.  Cardiovascular: Normal rate, regular rhythm, normal heart sounds and intact distal pulses.  Exam reveals no gallop and no friction rub.   No murmur heard. EKG normal   Pulmonary/Chest: Effort normal and breath sounds normal. No respiratory distress. She has no wheezes. She has no rales. She exhibits no  tenderness.  Abdominal: Soft. Bowel sounds are normal. She exhibits no distension and no mass. There is no tenderness. There is no rebound and no guarding.  Musculoskeletal: Normal range of motion. She exhibits no edema and no tenderness.  Lymphadenopathy:    She has no cervical adenopathy.  Neurological: She is alert and oriented to person, place, and time. She has normal reflexes. No cranial nerve deficit. She exhibits normal muscle tone. Coordination normal.  Skin: Skin is warm and dry. No rash noted. No erythema.  Psychiatric: She has a normal mood and affect. Her behavior is normal. Judgment and thought content normal.          Assessment & Plan:  Well exam. Set up a colonoscopy. Set up a DEXA. Try melatonin 5 mg qhs for sleep.

## 2014-09-11 ENCOUNTER — Other Ambulatory Visit: Payer: Self-pay

## 2014-09-11 DIAGNOSIS — Z1239 Encounter for other screening for malignant neoplasm of breast: Secondary | ICD-10-CM

## 2014-09-16 ENCOUNTER — Ambulatory Visit
Admission: RE | Admit: 2014-09-16 | Discharge: 2014-09-16 | Disposition: A | Discharge status: Home / Self Care | Payer: BC Managed Care – PPO | Source: Ambulatory Visit

## 2014-09-16 DIAGNOSIS — Z1239 Encounter for other screening for malignant neoplasm of breast: Secondary | ICD-10-CM

## 2014-09-19 ENCOUNTER — Other Ambulatory Visit: Payer: Self-pay | Admitting: Obstetrics and Gynecology

## 2014-09-22 LAB — CYTOLOGY - PAP

## 2015-02-17 ENCOUNTER — Telehealth: Payer: Self-pay | Admitting: Family Medicine

## 2015-02-17 ENCOUNTER — Other Ambulatory Visit: Payer: Self-pay

## 2015-02-17 MED ORDER — TRIAMTERENE-HCTZ 37.5-25 MG PO CAPS: ORAL_CAPSULE | ORAL | Status: DC

## 2015-02-17 NOTE — Telephone Encounter (Signed)
Patient need re-fill on triamterene-hydrochlorothiazide (DYAZIDE) 37.5-25 MG per capsule sent to  [id number] [address], [place]. [phone] (Phone) [phone] (Fax)       The pharmacy is telling her that she doesn't have any re-fills on the medication.  She has 1 pill left.

## 2015-02-17 NOTE — Telephone Encounter (Signed)
I called pharmacy again and left refill request on voice mail and spoke with pt.

## 2015-02-17 NOTE — Telephone Encounter (Signed)
Rx request for triamterene-HCTZ 37.5-25 mg capsule #90  Rx sent to pharmacy for 6 months.

## 2015-02-18 ENCOUNTER — Other Ambulatory Visit: Payer: Self-pay | Admitting: *Deleted

## 2015-02-18 MED ORDER — TRIAMTERENE-HCTZ 37.5-25 MG PO CAPS: ORAL_CAPSULE | ORAL | Status: DC

## 2015-03-18 ENCOUNTER — Encounter: Payer: Self-pay | Admitting: Family Medicine

## 2015-03-18 ENCOUNTER — Ambulatory Visit: Payer: Self-pay | Admitting: Family Medicine

## 2015-03-18 ENCOUNTER — Ambulatory Visit (INDEPENDENT_AMBULATORY_CARE_PROVIDER_SITE_OTHER): Payer: BLUE CROSS/BLUE SHIELD | Admitting: Family Medicine

## 2015-03-18 VITALS — BP 130/78 | Temp 98.4°F | Ht 67.5 in | Wt 184.0 lb

## 2015-03-18 DIAGNOSIS — J0100 Acute maxillary sinusitis, unspecified: Secondary | ICD-10-CM

## 2015-03-18 MED ORDER — AMOXICILLIN-POT CLAVULANATE 875-125 MG PO TABS: 1.0000 | ORAL_TABLET | Freq: Two times a day (BID) | ORAL | Status: DC

## 2015-03-18 MED ORDER — FLUCONAZOLE 150 MG PO TABS: 150.0000 mg | ORAL_TABLET | Freq: Once | ORAL | Status: DC

## 2015-03-18 NOTE — Progress Notes (Signed)
Pre visit review using our clinic review tool, if applicable. No additional management support is needed unless otherwise documented below in the visit note. 

## 2015-03-18 NOTE — Progress Notes (Signed)
   Subjective:    Patient ID: Morgan Doyle, female    DOB: 07/14/1966, 49 y.o.   MRN: [id number]  HPI Here for 3 weeks of sinus pressure, PND, ST, and a dry cough. On Allegra and Mucinex DM.   Review of Systems  Constitutional: Negative.   HENT: Positive for congestion, postnasal drip and sinus pressure.   Eyes: Negative.   Respiratory: Positive for cough.        Objective:   Physical Exam  Constitutional: She appears well-developed and well-nourished.  HENT:  Right Ear: External ear normal.  Left Ear: External ear normal.  Nose: Nose normal.  Mouth/Throat: Oropharynx is clear and moist.  Eyes: Conjunctivae are normal.  Pulmonary/Chest: Effort normal and breath sounds normal.  Lymphadenopathy:    She has no cervical adenopathy.          Assessment & Plan:  Recheck prn

## 2015-10-08 ENCOUNTER — Other Ambulatory Visit: Payer: Self-pay

## 2015-10-08 DIAGNOSIS — Z1231 Encounter for screening mammogram for malignant neoplasm of breast: Secondary | ICD-10-CM

## 2015-10-22 ENCOUNTER — Ambulatory Visit
Admission: RE | Admit: 2015-10-22 | Discharge: 2015-10-22 | Disposition: A | Discharge status: Home / Self Care | Payer: PRIVATE HEALTH INSURANCE | Source: Ambulatory Visit

## 2015-10-22 DIAGNOSIS — Z1231 Encounter for screening mammogram for malignant neoplasm of breast: Secondary | ICD-10-CM

## 2015-11-04 ENCOUNTER — Other Ambulatory Visit (INDEPENDENT_AMBULATORY_CARE_PROVIDER_SITE_OTHER): Payer: PRIVATE HEALTH INSURANCE

## 2015-11-04 DIAGNOSIS — Z0000 Encounter for general adult medical examination without abnormal findings: Secondary | ICD-10-CM | POA: Diagnosis not present

## 2015-11-04 LAB — LIPID PANEL
Cholesterol: 206 mg/dL — ABNORMAL HIGH (ref 0–200)
HDL: 51.7 mg/dL (ref >39.00)
LDL Cholesterol: 144 mg/dL — ABNORMAL HIGH (ref 0–99)
NONHDL: 153.87
TRIGLYCERIDES: 50 mg/dL (ref 0.0–149.0)
Total CHOL/HDL Ratio: 4
VLDL: 10 mg/dL (ref 0.0–40.0)

## 2015-11-04 LAB — HEPATIC FUNCTION PANEL
ALT: 18 U/L (ref 0–35)
AST: 20 U/L (ref 0–37)
Albumin: 4 g/dL (ref 3.5–5.2)
Alkaline Phosphatase: 58 U/L (ref 39–117)
Bilirubin, Direct: 0.1 mg/dL (ref 0.0–0.3)
TOTAL PROTEIN: 7.5 g/dL (ref 6.0–8.3)
Total Bilirubin: 0.5 mg/dL (ref 0.2–1.2)

## 2015-11-04 LAB — CBC WITH DIFFERENTIAL/PLATELET
BASOS PCT: 0.3 % (ref 0.0–3.0)
Basophils Absolute: 0 10*3/uL (ref 0.0–0.1)
EOS PCT: 2.1 % (ref 0.0–5.0)
Eosinophils Absolute: 0.1 10*3/uL (ref 0.0–0.7)
HEMATOCRIT: 46.3 % — AB (ref 36.0–46.0)
Hemoglobin: 14.9 g/dL (ref 12.0–15.0)
LYMPHS PCT: 48.5 % — AB (ref 12.0–46.0)
Lymphs Abs: 2 10*3/uL (ref 0.7–4.0)
MCHC: 32.3 g/dL (ref 30.0–36.0)
MCV: 81.4 fl (ref 78.0–100.0)
MONOS PCT: 7.7 % (ref 3.0–12.0)
Monocytes Absolute: 0.3 10*3/uL (ref 0.1–1.0)
NEUTROS ABS: 1.7 10*3/uL (ref 1.4–7.7)
Neutrophils Relative %: 41.4 % — ABNORMAL LOW (ref 43.0–77.0)
PLATELETS: 285 10*3/uL (ref 150.0–400.0)
RBC: 5.69 Mil/uL — ABNORMAL HIGH (ref 3.87–5.11)
RDW: 15.1 % (ref 11.5–15.5)
WBC: 4.2 10*3/uL (ref 4.0–10.5)

## 2015-11-04 LAB — BASIC METABOLIC PANEL
BUN: 20 mg/dL (ref 6–23)
CHLORIDE: 104 meq/L (ref 96–112)
CO2: 32 meq/L (ref 19–32)
Calcium: 10.1 mg/dL (ref 8.4–10.5)
Creatinine, Ser: 0.99 mg/dL (ref 0.40–1.20)
GFR: 75.98 mL/min (ref >60.00)
Glucose, Bld: 93 mg/dL (ref 70–99)
Potassium: 4 mEq/L (ref 3.5–5.1)
SODIUM: 143 meq/L (ref 135–145)

## 2015-11-04 LAB — POCT URINALYSIS DIPSTICK
Bilirubin, UA: NEGATIVE
GLUCOSE UA: NEGATIVE
Ketones, UA: NEGATIVE
Leukocytes, UA: NEGATIVE
NITRITE UA: NEGATIVE
PROTEIN UA: NEGATIVE
RBC UA: NEGATIVE
Spec Grav, UA: 1.02
UROBILINOGEN UA: 0.2
pH, UA: 7.5

## 2015-11-04 LAB — TSH: TSH: 1.03 u[IU]/mL (ref 0.35–4.50)

## 2015-11-11 ENCOUNTER — Ambulatory Visit (INDEPENDENT_AMBULATORY_CARE_PROVIDER_SITE_OTHER): Payer: BLUE CROSS/BLUE SHIELD | Admitting: Family Medicine

## 2015-11-11 ENCOUNTER — Encounter: Payer: Self-pay | Admitting: Family Medicine

## 2015-11-11 VITALS — BP 120/80 | Temp 99.1°F | Ht 67.5 in | Wt 186.0 lb

## 2015-11-11 DIAGNOSIS — Z0000 Encounter for general adult medical examination without abnormal findings: Secondary | ICD-10-CM | POA: Diagnosis not present

## 2015-11-11 NOTE — Progress Notes (Signed)
Pre visit review using our clinic review tool, if applicable. No additional management support is needed unless otherwise documented below in the visit note. 

## 2015-11-12 NOTE — Progress Notes (Signed)
   Subjective:    Patient ID: Morgan Doyle, female    DOB: [birth year], 49 y.o.   MRN: [id number]  HPI 49 yr old female for a cpx and to ask some questions. She has felt well physically but she has been struggling with some anxiety recently. She has been getting behind with projects from her job and this creates a lot of stress for her. She wonders if she has ADHD and she describes a pattern of procrastination and of doing things at the last minute. She has trouble focusing on tasks and she gets distracted easily. She has trouble reading a book or a magazine because she finds her mind wandering off topic and she can't remember what she just read.    Review of Systems  Constitutional: Negative.   HENT: Negative.   Eyes: Negative.   Respiratory: Negative.   Cardiovascular: Negative.   Gastrointestinal: Negative.   Genitourinary: Negative for dysuria, urgency, frequency, hematuria, flank pain, decreased urine volume, enuresis, difficulty urinating, pelvic pain and dyspareunia.  Musculoskeletal: Negative.   Skin: Negative.   Neurological: Negative.   Psychiatric/Behavioral: Negative.        Objective:   Physical Exam  Constitutional: She is oriented to person, place, and time. She appears well-developed and well-nourished. No distress.  HENT:  Head: Normocephalic and atraumatic.  Right Ear: External ear normal.  Left Ear: External ear normal.  Nose: Nose normal.  Mouth/Throat: Oropharynx is clear and moist. No oropharyngeal exudate.  Eyes: Conjunctivae and EOM are normal. Pupils are equal, round, and reactive to light. No scleral icterus.  Neck: Normal range of motion. Neck supple. No JVD present. No thyromegaly present.  Cardiovascular: Normal rate, regular rhythm, normal heart sounds and intact distal pulses.  Exam reveals no gallop and no friction rub.   No murmur heard. EKG normal   Pulmonary/Chest: Effort normal and breath sounds normal. No respiratory distress. She has no  wheezes. She has no rales. She exhibits no tenderness.  Abdominal: Soft. Bowel sounds are normal. She exhibits no distension and no mass. There is no tenderness. There is no rebound and no guarding.  Musculoskeletal: Normal range of motion. She exhibits no edema or tenderness.  Lymphadenopathy:    She has no cervical adenopathy.  Neurological: She is alert and oriented to person, place, and time. She has normal reflexes. No cranial nerve deficit. She exhibits normal muscle tone. Coordination normal.  Skin: Skin is warm and dry. No rash noted. No erythema.  Psychiatric: She has a normal mood and affect. Her behavior is normal. Judgment and thought content normal.          Assessment & Plan:  Well exam. We will refer her for a colonoscopy. I suggested she see Dr. Irven Shelling for psychological testing to see if she meets criteria for ADHD. If so, she can see [place] about treatment

## 2015-11-16 ENCOUNTER — Encounter: Payer: Self-pay | Admitting: Gastroenterology

## 2015-12-21 ENCOUNTER — Ambulatory Visit (INDEPENDENT_AMBULATORY_CARE_PROVIDER_SITE_OTHER): Payer: PRIVATE HEALTH INSURANCE | Admitting: Psychology

## 2015-12-21 DIAGNOSIS — F908 Attention-deficit hyperactivity disorder, other type: Secondary | ICD-10-CM

## 2015-12-29 ENCOUNTER — Ambulatory Visit (AMBULATORY_SURGERY_CENTER): Payer: PRIVATE HEALTH INSURANCE | Admitting: *Deleted

## 2015-12-29 VITALS — Ht 67.0 in | Wt 191.0 lb

## 2015-12-29 DIAGNOSIS — Z1211 Encounter for screening for malignant neoplasm of colon: Secondary | ICD-10-CM

## 2015-12-29 MED ORDER — NA SULFATE-K SULFATE-MG SULF 17.5-3.13-1.6 GM/177ML PO SOLN: 1.0000 | Freq: Once | ORAL | Status: DC

## 2015-12-29 NOTE — Progress Notes (Signed)
No egg or soy allergy known to patient  No issues with past sedation with any surgeries  or procedures, no intubation problems  No diet pills per patient  No home 02 use per patient   emmi video to  [email or web address]

## 2016-01-11 ENCOUNTER — Other Ambulatory Visit: Payer: Self-pay | Admitting: Family Medicine

## 2016-01-11 MED ORDER — TRIAMTERENE-HCTZ 37.5-25 MG PO CAPS: ORAL_CAPSULE | ORAL | Status: DC

## 2016-01-12 ENCOUNTER — Ambulatory Visit (AMBULATORY_SURGERY_CENTER): Payer: PRIVATE HEALTH INSURANCE | Admitting: Gastroenterology

## 2016-01-12 ENCOUNTER — Encounter: Payer: Self-pay | Admitting: Gastroenterology

## 2016-01-12 VITALS — BP 119/82 | HR 60 | Temp 98.2°F | Resp 23 | Ht 67.0 in | Wt 191.0 lb

## 2016-01-12 DIAGNOSIS — Z1211 Encounter for screening for malignant neoplasm of colon: Secondary | ICD-10-CM | POA: Diagnosis not present

## 2016-01-12 DIAGNOSIS — D128 Benign neoplasm of rectum: Secondary | ICD-10-CM | POA: Diagnosis not present

## 2016-01-12 HISTORY — PX: COLONOSCOPY: SHX174

## 2016-01-12 MED ORDER — SODIUM CHLORIDE 0.9 % IV SOLN: 500.0000 mL | INTRAVENOUS | Status: DC

## 2016-01-12 NOTE — Op Note (Signed)
[facility]  [address]. [place], [address]   COLONOSCOPY PROCEDURE REPORT  PATIENT: Morgan Doyle, Morgan Doyle  MR#: [id number] BIRTHDATE: [birth year] , 70  yrs. old GENDER: female ENDOSCOPIST: Harl Bowie, MD REFERRED [id number] Sarajane Jews, M.D. PROCEDURE DATE:  01/12/2016 PROCEDURE:   Colonoscopy, screening and Colonoscopy with cold biopsy polypectomy First Screening Colonoscopy - Avg.  risk and is 50 yrs.  old or older Yes.  Prior Negative Screening - Now for repeat screening. N/A  History of Adenoma - Now for follow-up colonoscopy & has been > or = to 3 yrs.  N/A  Polyps removed today? Yes ASA CLASS:   Class II INDICATIONS:Screening for colonic neoplasia and Colorectal Neoplasm Risk Assessment for this procedure is average risk. MEDICATIONS: Propofol 250 mg IV  DESCRIPTION OF PROCEDURE:   After the risks benefits and alternatives of the procedure were thoroughly explained, informed consent was obtained.  The digital rectal exam revealed no abnormalities of the rectum.   The LB PFC-H190 [id number]  endoscope was introduced through the anus and advanced to the cecum, which was identified by both the appendix and ileocecal valve. No adverse events experienced.   The quality of the prep was good.  The instrument was then slowly withdrawn as the colon was fully examined. Estimated blood loss is zero unless otherwise noted in this procedure report.   COLON FINDINGS: Two flat polyps ranging between 3-81mm in size were found in the rectum.  A polypectomy was performed with cold forceps.  The resection was complete, the polyp tissue was completely retrieved and sent to histology.   The examination was otherwise normal.  Retroflexed views revealed internal hemorrhoids. The time to cecum = 5.2 Withdrawal time = 12.8   The scope was withdrawn and the procedure completed. COMPLICATIONS: There were no immediate complications.  ENDOSCOPIC IMPRESSION: Two flat polyps ranging  between 3-61mm in size were found in the rectum; polypectomy was performed with cold forceps  RECOMMENDATIONS: 1.  Await pathology results 2.  If the polyp(s) removed today are proven to be adenomatous (pre-cancerous) polyps, you will need a repeat colonoscopy in 5 years.  Otherwise you should continue to follow colorectal cancer screening guidelines for "routine risk" patients with colonoscopy in 10 years.  You will receive a letter within 1-2 weeks with the results of your biopsy as well as final recommendations.  Please call my office if you have not received a letter after 3 weeks.  eSigned:  Harl Bowie, MD 01/12/2016 8:33 AM

## 2016-01-12 NOTE — Progress Notes (Signed)
Patient awakening,vss,report to rn 

## 2016-01-12 NOTE — Progress Notes (Signed)
Called to room to assist during endoscopic procedure.  Patient ID and intended procedure confirmed with present staff. Received instructions for my participation in the procedure from the performing physician.  

## 2016-01-12 NOTE — Patient Instructions (Signed)
YOU HAD AN ENDOSCOPIC PROCEDURE TODAY AT [facility]:   Refer to the procedure report that was given to you for any specific questions about what was found during the examination.  If the procedure report does not answer your questions, please call your gastroenterologist to clarify.  If you requested that your care partner not be given the details of your procedure findings, then the procedure report has been included in a sealed envelope for you to review at your convenience later.  YOU SHOULD EXPECT: Some feelings of bloating in the abdomen. Passage of more gas than usual.  Walking can help get rid of the air that was put into your GI tract during the procedure and reduce the bloating. If you had a lower endoscopy (such as a colonoscopy or flexible sigmoidoscopy) you may notice spotting of blood in your stool or on the toilet paper. If you underwent a bowel prep for your procedure, you may not have a normal bowel movement for a few days.  Please Note:  You might notice some irritation and congestion in your nose or some drainage.  This is from the oxygen used during your procedure.  There is no need for concern and it should clear up in a day or so.  SYMPTOMS TO REPORT IMMEDIATELY:   Following lower endoscopy (colonoscopy or flexible sigmoidoscopy):  Excessive amounts of blood in the stool  Significant tenderness or worsening of abdominal pains  Swelling of the abdomen that is new, acute  Fever of 100F or higher   For urgent or emergent issues, a gastroenterologist can be reached at any hour by calling [phone].   DIET: Your first meal following the procedure should be a small meal and then it is ok to progress to your normal diet. Heavy or fried foods are harder to digest and may make you feel nauseous or bloated.  Likewise, meals heavy in dairy and vegetables can increase bloating.  Drink plenty of fluids but you should avoid alcoholic beverages for 24  hours.  ACTIVITY:  You should plan to take it easy for the rest of today and you should NOT DRIVE or use heavy machinery until tomorrow (because of the sedation medicines used during the test).    FOLLOW UP: Our staff will call the number listed on your records the next business day following your procedure to check on you and address any questions or concerns that you may have regarding the information given to you following your procedure. If we do not reach you, we will leave a message.  However, if you are feeling well and you are not experiencing any problems, there is no need to return our call.  We will assume that you have returned to your regular daily activities without incident.  If any biopsies were taken you will be contacted by phone or by letter within the next 1-3 weeks.  Please call us at [phone] if you have not heard about the biopsies in 3 weeks.    SIGNATURES/CONFIDENTIALITY: You and/or your care partner have signed paperwork which will be entered into your electronic medical record.  These signatures attest to the fact that that the information above on your After Visit Summary has been reviewed and is understood.  Full responsibility of the confidentiality of this discharge information lies with you and/or your care-partner.  Polyp handout given Await pathology

## 2016-01-13 ENCOUNTER — Telehealth: Payer: Self-pay

## 2016-01-13 ENCOUNTER — Ambulatory Visit (INDEPENDENT_AMBULATORY_CARE_PROVIDER_SITE_OTHER): Payer: No Typology Code available for payment source | Admitting: Psychology

## 2016-01-13 DIAGNOSIS — F330 Major depressive disorder, recurrent, mild: Secondary | ICD-10-CM | POA: Diagnosis not present

## 2016-01-13 DIAGNOSIS — F908 Attention-deficit hyperactivity disorder, other type: Secondary | ICD-10-CM

## 2016-01-13 NOTE — Telephone Encounter (Signed)
  Follow up Call-  Call back number 01/12/2016  Post procedure Call Back phone  # [phone]  Permission to leave phone message Yes     Patient questions:  Do you have a fever, pain , or abdominal swelling? No. Pain Score  0 *  Have you tolerated food without any problems? Yes.    Have you been able to return to your normal activities? Yes.    Do you have any questions about your discharge instructions: Diet   No. Medications  No. Follow up visit  No.  Do you have questions or concerns about your Care? No.  Actions: * If pain score is 4 or above: No action needed, pain <4.

## 2016-01-14 ENCOUNTER — Other Ambulatory Visit: Payer: Self-pay | Admitting: *Deleted

## 2016-01-26 ENCOUNTER — Encounter: Payer: Self-pay | Admitting: Gastroenterology

## 2016-05-12 ENCOUNTER — Ambulatory Visit (INDEPENDENT_AMBULATORY_CARE_PROVIDER_SITE_OTHER): Payer: BLUE CROSS/BLUE SHIELD | Admitting: Adult Health

## 2016-05-12 ENCOUNTER — Encounter: Payer: Self-pay | Admitting: Adult Health

## 2016-05-12 VITALS — BP 128/98 | Temp 98.4°F | Ht 67.0 in | Wt 183.9 lb

## 2016-05-12 DIAGNOSIS — J0140 Acute pansinusitis, unspecified: Secondary | ICD-10-CM

## 2016-05-12 NOTE — Patient Instructions (Signed)
It was great meeting you today and I am sorry you are feeling so badly.   I would advise using Flonase and Claritin to help with your symptoms.   Use Mucinex to help with the cough.   Drink plenty of fluid and rest as much as possible.    General Recommendations:    Please drink plenty of fluids.  Get plenty of rest   Sleep in humidified air  Use saline nasal sprays  Netti pot   OTC Medications:  Decongestants - helps relieve congestion   Flonase (generic fluticasone) or Nasacort (generic triamcinolone) - please make sure to use the "cross-over" technique at a 45 degree angle towards the opposite eye as opposed to straight up the nasal passageway.   Sudafed (generic pseudoephedrine - Note this is the one that is available behind the pharmacy counter); Products with phenylephrine (-PE) may also be used but is often not as effective as pseudoephedrine.   If you have HIGH BLOOD PRESSURE - Coricidin HBP; AVOID any product that is -D as this contains pseudoephedrine which may increase your blood pressure.  Afrin (oxymetazoline) every 6-8 hours for up to 3 days.   Allergies - helps relieve runny nose, itchy eyes and sneezing   Claritin (generic loratidine), Allegra (fexofenidine), or Zyrtec (generic cyrterizine) for runny nose. These medications should not cause drowsiness.  Note - Benadryl (generic diphenhydramine) may be used however may cause drowsiness  Cough -   Delsym or Robitussin (generic dextromethorphan)  Expectorants - helps loosen mucus to ease removal   Mucinex (generic guaifenesin) as directed on the package.  Headaches / General Aches   Tylenol (generic acetaminophen) - DO NOT EXCEED 3 grams (3,000 mg) in a 24 hour time period  Advil/Motrin (generic ibuprofen)   Sore Throat -   Salt water gargle   Chloraseptic (generic benzocaine) spray or lozenges / Sucrets (generic dyclonine)    Sinusitis Sinusitis is redness, soreness, and inflammation  of the paranasal sinuses. Paranasal sinuses are air pockets within the bones of your face (beneath the eyes, the middle of the forehead, or above the eyes). In healthy paranasal sinuses, mucus is able to drain out, and air is able to circulate through them by way of your nose. However, when your paranasal sinuses are inflamed, mucus and air can become trapped. This can allow bacteria and other germs to grow and cause infection. Sinusitis can develop quickly and last only a short time (acute) or continue over a long period (chronic). Sinusitis that lasts for more than 12 weeks is considered chronic.  CAUSES  Causes of sinusitis include:  Allergies.  Structural abnormalities, such as displacement of the cartilage that separates your nostrils (deviated septum), which can decrease the air flow through your nose and sinuses and affect sinus drainage.  Functional abnormalities, such as when the small hairs (cilia) that line your sinuses and help remove mucus do not work properly or are not present. SIGNS AND SYMPTOMS  Symptoms of acute and chronic sinusitis are the same. The primary symptoms are pain and pressure around the affected sinuses. Other symptoms include:  Upper toothache.  Earache.  Headache.  Bad breath.  Decreased sense of smell and taste.  A cough, which worsens when you are lying flat.  Fatigue.  Fever.  Thick drainage from your nose, which often is green and may contain pus (purulent).  Swelling and warmth over the affected sinuses. DIAGNOSIS  Your health care provider will perform a physical exam. During the exam, your health  care provider may:  Look in your nose for signs of abnormal growths in your nostrils (nasal polyps).  Tap over the affected sinus to check for signs of infection.  View the inside of your sinuses (endoscopy) using an imaging device that has a light attached (endoscope). If your health care provider suspects that you have chronic sinusitis, one  or more of the following tests may be recommended:  Allergy tests.  Nasal culture. A sample of mucus is taken from your nose, sent to a lab, and screened for bacteria.  Nasal cytology. A sample of mucus is taken from your nose and examined by your health care provider to determine if your sinusitis is related to an allergy. TREATMENT  Most cases of acute sinusitis are related to a viral infection and will resolve on their own within 10 days. Sometimes medicines are prescribed to help relieve symptoms (pain medicine, decongestants, nasal steroid sprays, or saline sprays).  However, for sinusitis related to a bacterial infection, your health care provider will prescribe antibiotic medicines. These are medicines that will help kill the bacteria causing the infection.  Rarely, sinusitis is caused by a fungal infection. In theses cases, your health care provider will prescribe antifungal medicine. For some cases of chronic sinusitis, surgery is needed. Generally, these are cases in which sinusitis recurs more than 3 times per year, despite other treatments. HOME CARE INSTRUCTIONS   Drink plenty of water. Water helps thin the mucus so your sinuses can drain more easily.  Use a humidifier.  Inhale steam 3 to 4 times a day (for example, sit in the bathroom with the shower running).  Apply a warm, moist washcloth to your face 3 to 4 times a day, or as directed by your health care provider.  Use saline nasal sprays to help moisten and clean your sinuses.  Take medicines only as directed by your health care provider.  If you were prescribed either an antibiotic or antifungal medicine, finish it all even if you start to feel better. SEEK IMMEDIATE MEDICAL CARE IF:  You have increasing pain or severe headaches.  You have nausea, vomiting, or drowsiness.  You have swelling around your face.  You have vision problems.  You have a stiff neck.  You have difficulty breathing. MAKE SURE YOU:    Understand these instructions.  Will watch your condition.  Will get help right away if you are not doing well or get worse. Document Released: 11/28/2005 Document Revised: 04/14/2014 Document Reviewed: 12/13/2011 [facility] Patient Information 2015 [place], [place]. This information is not intended to replace advice given to you by your health care provider. Make sure you discuss any questions you have with your health care provider.

## 2016-05-12 NOTE — Progress Notes (Signed)
Subjective:    Patient ID: Morgan Doyle, female    DOB: 01-Jan-1966, 50 y.o.   MRN: [id number]  Sinusitis This is a new problem. The current episode started yesterday. There has been no fever. Associated symptoms include congestion (nasal congestion ), coughing (semi productive cough ), headaches and sinus pressure. Pertinent negatives include no chills, ear pain or shortness of breath. Past treatments include oral decongestants. The treatment provided mild relief.    Review of Systems  Constitutional: Positive for activity change and fatigue. Negative for fever and chills.  HENT: Positive for congestion (nasal congestion ) and sinus pressure. Negative for ear pain.   Eyes: Negative.   Respiratory: Positive for cough (semi productive cough ). Negative for shortness of breath.   Neurological: Positive for headaches.    Past Medical History  Diagnosis Date  . Hypertension   . Anemia     History of anemia  . Headache(784.0)     OTC excedrin   . Arthritis     no meds  . Allergy     seasonal    Social History   Social History  . Marital Status: Married    Spouse Name: N/A  . Number of Children: N/A  . Years of Education: N/A   Occupational History  . Not on file.   Social History Main Topics  . Smoking status: Never Smoker   . Smokeless tobacco: Never Used  . Alcohol Use: No  . Drug Use: No  . Sexual Activity: Yes    Birth Control/ Protection: Pill   Other Topics Concern  . Not on file   Social History Narrative    Past Surgical History  Procedure Laterality Date  . Dilation and evacuation       2006  MAB  . Cryoablation      IN oFFICE PER Pt - 1990's  . Svd      1996  . Cystoscopy  08/10/2011    Procedure: CYSTOSCOPY;  Surgeon: Daria Pastures;  Location: [facility];  Service: Gynecology;;  . Abdominal hysterectomy  08-10-11    TAH and left SO per Dr. Philis Pique     Family History  Problem Relation Age of Onset  . Colon polyps Mother   . Colon cancer  Paternal Uncle   . Stomach cancer Paternal Uncle   . Esophageal cancer Neg Hx   . Rectal cancer Neg Hx   . Clotting disorder Mother     Allergies  Allergen Reactions  . Codeine Nausea And Vomiting    Hallucination-heart racing nausea. Dizziness, stomach pains   . Other Hives    NUTS,Pt. STATED SWELLING  . Pollen Extract Itching    Itching, sneezing, hives, watery eyes,cough, sneezing  . Tree Extract     Nuts-throat swells, mouth itching, rash   . Latex Rash    Bumps and itching last time at hospital.    Current Outpatient Prescriptions on File Prior to Visit  Medication Sig Dispense Refill  . aspirin-acetaminophen-caffeine (EXCEDRIN MIGRAINE) 250-250-65 MG tablet Take 1 tablet by mouth every 6 (six) hours as needed for headache. 1-2 tablets as needed    . fexofenadine (ALLEGRA) 180 MG tablet Take 180 mg by mouth once as needed.      Marland Kitchen ibuprofen (ADVIL,MOTRIN) 200 MG tablet Take 200 mg by mouth as needed.     . Multiple Vitamin (MULTIVITAMIN) tablet Take 1 tablet by mouth daily.    Marland Kitchen triamterene-hydrochlorothiazide (DYAZIDE) 37.5-25 MG capsule TAKE ONE CAPSULE BY MOUTH  EVERY MORNING 90 capsule 2  . valACYclovir (VALTREX) 500 MG tablet TAKE 1 TABLET(S) EVERY DAY BY ORAL ROUTE.  4  . Vitamins A & D (BL COD LIVER OIL PO) Take by mouth. Reported on 05/12/2016     No current facility-administered medications on file prior to visit.    BP 128/98 mmHg  Temp(Src) 98.4 F (36.9 C) (Oral)  Ht 5\' 7"  (1.702 m)  Wt 183 lb 14.4 oz (83.416 kg)  BMI 28.80 kg/m2  LMP 05/10/2011       Objective:   Physical Exam  Constitutional: She is oriented to person, place, and time. She appears well-developed and well-nourished. No distress.  HENT:  Head: Normocephalic and atraumatic.  Right Ear: Hearing, tympanic membrane, external ear and ear canal normal.  Left Ear: Hearing, tympanic membrane, external ear and ear canal normal.  Nose: Mucosal edema present. No rhinorrhea. Right sinus exhibits  maxillary sinus tenderness and frontal sinus tenderness. Left sinus exhibits maxillary sinus tenderness and frontal sinus tenderness.  Mouth/Throat: Oropharynx is clear and moist. No oropharyngeal exudate, posterior oropharyngeal edema, posterior oropharyngeal erythema or tonsillar abscesses.  Eyes: Conjunctivae and EOM are normal. Pupils are equal, round, and reactive to light. Right eye exhibits no discharge. Left eye exhibits no discharge. No scleral icterus.  Neck: Normal range of motion. Neck supple.  Cardiovascular: Normal rate, regular rhythm, normal heart sounds and intact distal pulses.  Exam reveals no gallop and no friction rub.   No murmur heard. Pulmonary/Chest: Effort normal and breath sounds normal. No respiratory distress. She has no wheezes. She has no rales. She exhibits no tenderness.  Lymphadenopathy:    She has no cervical adenopathy.  Neurological: She is alert and oriented to person, place, and time.  Skin: Skin is warm and dry. No rash noted. She is not diaphoretic. No erythema. No pallor.  Psychiatric: She has a normal mood and affect. Her behavior is normal. Judgment and thought content normal.  Nursing note and vitals reviewed.      Assessment & Plan:  1. Acute pansinusitis, recurrence not specified - Likely viral in nature.  - Advised conservative measures such as Flonase/Claritin/Nsaids/Rest/fluids.  - She can also use Mucinex  - Follow up if no improvement or if symptoms worsen  Dorothyann Peng, NP

## 2016-11-07 ENCOUNTER — Other Ambulatory Visit (INDEPENDENT_AMBULATORY_CARE_PROVIDER_SITE_OTHER): Payer: Managed Care, Other (non HMO)

## 2016-11-07 DIAGNOSIS — Z0000 Encounter for general adult medical examination without abnormal findings: Secondary | ICD-10-CM | POA: Diagnosis not present

## 2016-11-07 LAB — BASIC METABOLIC PANEL
BUN: 14 mg/dL (ref 6–23)
CO2: 31 mEq/L (ref 19–32)
Calcium: 9.5 mg/dL (ref 8.4–10.5)
Chloride: 105 mEq/L (ref 96–112)
Creatinine, Ser: 0.97 mg/dL (ref 0.40–1.20)
GFR: 77.48 mL/min (ref >60.00)
Glucose, Bld: 97 mg/dL (ref 70–99)
POTASSIUM: 3.4 meq/L — AB (ref 3.5–5.1)
SODIUM: 142 meq/L (ref 135–145)

## 2016-11-07 LAB — POC URINALSYSI DIPSTICK (AUTOMATED)
Bilirubin, UA: NEGATIVE
Glucose, UA: NEGATIVE
KETONES UA: NEGATIVE
LEUKOCYTES UA: NEGATIVE
NITRITE UA: NEGATIVE
PH UA: 6.5
RBC UA: NEGATIVE
Spec Grav, UA: 1.02
Urobilinogen, UA: 1

## 2016-11-07 LAB — LIPID PANEL
CHOLESTEROL: 206 mg/dL — AB (ref 0–200)
HDL: 47.6 mg/dL (ref >39.00)
LDL CALC: 144 mg/dL — AB (ref 0–99)
NonHDL: 158.8
Total CHOL/HDL Ratio: 4
Triglycerides: 75 mg/dL (ref 0.0–149.0)
VLDL: 15 mg/dL (ref 0.0–40.0)

## 2016-11-07 LAB — CBC WITH DIFFERENTIAL/PLATELET
Basophils Absolute: 0 10*3/uL (ref 0.0–0.1)
Basophils Relative: 0.4 % (ref 0.0–3.0)
EOS PCT: 1.8 % (ref 0.0–5.0)
Eosinophils Absolute: 0.1 10*3/uL (ref 0.0–0.7)
HCT: 43 % (ref 36.0–46.0)
HEMOGLOBIN: 14 g/dL (ref 12.0–15.0)
LYMPHS PCT: 54.4 % — AB (ref 12.0–46.0)
Lymphs Abs: 2.5 10*3/uL (ref 0.7–4.0)
MCHC: 32.5 g/dL (ref 30.0–36.0)
MCV: 79.6 fl (ref 78.0–100.0)
MONOS PCT: 6.9 % (ref 3.0–12.0)
Monocytes Absolute: 0.3 10*3/uL (ref 0.1–1.0)
Neutro Abs: 1.7 10*3/uL (ref 1.4–7.7)
Neutrophils Relative %: 36.5 % — ABNORMAL LOW (ref 43.0–77.0)
Platelets: 275 10*3/uL (ref 150.0–400.0)
RBC: 5.4 Mil/uL — AB (ref 3.87–5.11)
RDW: 15.3 % (ref 11.5–15.5)
WBC: 4.5 10*3/uL (ref 4.0–10.5)

## 2016-11-07 LAB — TSH: TSH: 1.07 u[IU]/mL (ref 0.35–4.50)

## 2016-11-07 LAB — HEPATIC FUNCTION PANEL
ALBUMIN: 4 g/dL (ref 3.5–5.2)
ALT: 15 U/L (ref 0–35)
AST: 19 U/L (ref 0–37)
Alkaline Phosphatase: 56 U/L (ref 39–117)
Bilirubin, Direct: 0 mg/dL (ref 0.0–0.3)
Total Bilirubin: 0.4 mg/dL (ref 0.2–1.2)
Total Protein: 7.4 g/dL (ref 6.0–8.3)

## 2016-11-11 ENCOUNTER — Encounter: Payer: Self-pay | Admitting: Family Medicine

## 2016-11-11 ENCOUNTER — Ambulatory Visit (INDEPENDENT_AMBULATORY_CARE_PROVIDER_SITE_OTHER): Payer: BLUE CROSS/BLUE SHIELD | Admitting: Family Medicine

## 2016-11-11 VITALS — BP 120/92 | HR 71 | Temp 98.2°F | Ht 67.0 in | Wt 190.2 lb

## 2016-11-11 DIAGNOSIS — Z0000 Encounter for general adult medical examination without abnormal findings: Secondary | ICD-10-CM

## 2016-11-11 MED ORDER — VALACYCLOVIR HCL 500 MG PO TABS: ORAL_TABLET | ORAL | 3 refills | Status: DC

## 2016-11-11 MED ORDER — POTASSIUM CHLORIDE ER 10 MEQ PO TBCR: 10.0000 meq | EXTENDED_RELEASE_TABLET | Freq: Every day | ORAL | 3 refills | Status: DC

## 2016-11-11 MED ORDER — TRIAMTERENE-HCTZ 37.5-25 MG PO CAPS: ORAL_CAPSULE | ORAL | 3 refills | Status: DC

## 2016-11-11 NOTE — Progress Notes (Signed)
   Subjective:    Patient ID: Morgan Doyle, female    DOB: 27-Aug-1966, 50 y.o.   MRN: [id number]  HPI 50 yr old female for a well exam. She feels fine. Her BP at home has been stable.     Review of Systems  Constitutional: Negative.   HENT: Negative.   Eyes: Negative.   Respiratory: Negative.   Cardiovascular: Negative.   Gastrointestinal: Negative.   Genitourinary: Negative for decreased urine volume, difficulty urinating, dyspareunia, dysuria, enuresis, flank pain, frequency, hematuria, pelvic pain and urgency.  Musculoskeletal: Negative.   Skin: Negative.   Neurological: Negative.   Psychiatric/Behavioral: Negative.        Objective:   Physical Exam  Constitutional: She is oriented to person, place, and time. She appears well-developed and well-nourished. No distress.  HENT:  Head: Normocephalic and atraumatic.  Right Ear: External ear normal.  Left Ear: External ear normal.  Nose: Nose normal.  Mouth/Throat: Oropharynx is clear and moist. No oropharyngeal exudate.  Eyes: Conjunctivae and EOM are normal. Pupils are equal, round, and reactive to light. No scleral icterus.  Neck: Normal range of motion. Neck supple. No JVD present. No thyromegaly present.  Cardiovascular: Normal rate, regular rhythm, normal heart sounds and intact distal pulses.  Exam reveals no gallop and no friction rub.   No murmur heard. Pulmonary/Chest: Effort normal and breath sounds normal. No respiratory distress. She has no wheezes. She has no rales. She exhibits no tenderness.  Abdominal: Soft. Bowel sounds are normal. She exhibits no distension and no mass. There is no tenderness. There is no rebound and no guarding.  Musculoskeletal: Normal range of motion. She exhibits no edema or tenderness.  Lymphadenopathy:    She has no cervical adenopathy.  Neurological: She is alert and oriented to person, place, and time. She has normal reflexes. No cranial nerve deficit. She exhibits normal muscle  tone. Coordination normal.  Skin: Skin is warm and dry. No rash noted. No erythema.  Psychiatric: She has a normal mood and affect. Her behavior is normal. Judgment and thought content normal.          Assessment & Plan:  Well exam. We discussed diet and exercise. Start on Klor-con 10 mEq daily.  Laurey Morale, MD

## 2016-11-11 NOTE — Progress Notes (Signed)
Pre visit review using our clinic review tool, if applicable. No additional management support is needed unless otherwise documented below in the visit note. 

## 2016-11-22 ENCOUNTER — Other Ambulatory Visit: Payer: Self-pay | Admitting: Family Medicine

## 2016-11-25 ENCOUNTER — Other Ambulatory Visit: Payer: Self-pay | Admitting: Obstetrics and Gynecology

## 2016-11-25 DIAGNOSIS — N63 Unspecified lump in unspecified breast: Secondary | ICD-10-CM

## 2016-12-14 ENCOUNTER — Other Ambulatory Visit: Payer: Self-pay | Admitting: Obstetrics and Gynecology

## 2016-12-14 DIAGNOSIS — N63 Unspecified lump in unspecified breast: Secondary | ICD-10-CM

## 2016-12-30 ENCOUNTER — Other Ambulatory Visit: Payer: Self-pay

## 2017-01-02 ENCOUNTER — Ambulatory Visit
Admission: RE | Admit: 2017-01-02 | Discharge: 2017-01-02 | Disposition: A | Discharge status: Home / Self Care | Payer: BLUE CROSS/BLUE SHIELD | Source: Ambulatory Visit | Attending: Obstetrics and Gynecology | Admitting: Obstetrics and Gynecology

## 2017-01-02 DIAGNOSIS — N63 Unspecified lump in unspecified breast: Secondary | ICD-10-CM

## 2017-03-28 ENCOUNTER — Encounter: Payer: Self-pay | Admitting: Family Medicine

## 2017-03-28 ENCOUNTER — Ambulatory Visit (INDEPENDENT_AMBULATORY_CARE_PROVIDER_SITE_OTHER): Payer: BLUE CROSS/BLUE SHIELD | Admitting: Family Medicine

## 2017-03-28 VITALS — BP 119/88 | HR 74 | Temp 98.2°F | Ht 67.0 in | Wt 186.0 lb

## 2017-03-28 DIAGNOSIS — R112 Nausea with vomiting, unspecified: Secondary | ICD-10-CM | POA: Diagnosis not present

## 2017-03-28 DIAGNOSIS — R197 Diarrhea, unspecified: Secondary | ICD-10-CM | POA: Diagnosis not present

## 2017-03-28 LAB — BASIC METABOLIC PANEL
BUN: 14 mg/dL (ref 6–23)
CHLORIDE: 103 meq/L (ref 96–112)
CO2: 31 meq/L (ref 19–32)
CREATININE: 1.03 mg/dL (ref 0.40–1.20)
Calcium: 9.5 mg/dL (ref 8.4–10.5)
GFR: 72.19 mL/min (ref >60.00)
GLUCOSE: 92 mg/dL (ref 70–99)
POTASSIUM: 3.5 meq/L (ref 3.5–5.1)
Sodium: 140 mEq/L (ref 135–145)

## 2017-03-28 LAB — CBC WITH DIFFERENTIAL/PLATELET
BASOS ABS: 0 10*3/uL (ref 0.0–0.1)
Basophils Relative: 0.4 % (ref 0.0–3.0)
EOS ABS: 0.1 10*3/uL (ref 0.0–0.7)
Eosinophils Relative: 2.2 % (ref 0.0–5.0)
HEMATOCRIT: 40.5 % (ref 36.0–46.0)
HEMOGLOBIN: 13.3 g/dL (ref 12.0–15.0)
LYMPHS PCT: 50.9 % — AB (ref 12.0–46.0)
Lymphs Abs: 2.3 10*3/uL (ref 0.7–4.0)
MCHC: 32.8 g/dL (ref 30.0–36.0)
MCV: 81.1 fl (ref 78.0–100.0)
MONOS PCT: 6.3 % (ref 3.0–12.0)
Monocytes Absolute: 0.3 10*3/uL (ref 0.1–1.0)
NEUTROS ABS: 1.8 10*3/uL (ref 1.4–7.7)
Neutrophils Relative %: 40.2 % — ABNORMAL LOW (ref 43.0–77.0)
PLATELETS: 282 10*3/uL (ref 150.0–400.0)
RBC: 4.99 Mil/uL (ref 3.87–5.11)
RDW: 14.5 % (ref 11.5–15.5)
WBC: 4.5 10*3/uL (ref 4.0–10.5)

## 2017-03-28 LAB — POC URINALSYSI DIPSTICK (AUTOMATED)
Bilirubin, UA: NEGATIVE
Glucose, UA: NEGATIVE
Ketones, UA: NEGATIVE
Leukocytes, UA: NEGATIVE
Nitrite, UA: NEGATIVE
PROTEIN UA: NEGATIVE
RBC UA: NEGATIVE
Spec Grav, UA: 1.01 (ref 1.010–1.025)
UROBILINOGEN UA: 0.2 U/dL
pH, UA: 6.5 (ref 5.0–8.0)

## 2017-03-28 LAB — HEPATIC FUNCTION PANEL
ALBUMIN: 3.9 g/dL (ref 3.5–5.2)
ALT: 12 U/L (ref 0–35)
AST: 16 U/L (ref 0–37)
Alkaline Phosphatase: 58 U/L (ref 39–117)
BILIRUBIN TOTAL: 0.5 mg/dL (ref 0.2–1.2)
Bilirubin, Direct: 0.1 mg/dL (ref 0.0–0.3)
Total Protein: 7 g/dL (ref 6.0–8.3)

## 2017-03-28 NOTE — Progress Notes (Signed)
Pre visit review using our clinic review tool, if applicable. No additional management support is needed unless otherwise documented below in the visit note. 

## 2017-03-28 NOTE — Patient Instructions (Signed)
WE NOW OFFER   [facility] FAST TRACK!!!  SAME DAY Appointments for ACUTE CARE  Such as: Sprains, Injuries, cuts, abrasions, rashes, muscle pain, joint pain, back pain Colds, flu, sore throats, headache, allergies, cough, fever  Ear pain, sinus and eye infections Abdominal pain, nausea, vomiting, diarrhea, upset stomach Animal/insect bites  3 Easy Ways to Schedule: Walk-In Scheduling Call in scheduling Mychart Sign-up: [email or web address]         

## 2017-03-28 NOTE — Progress Notes (Signed)
   Subjective:    Patient ID: Morgan Doyle, female    DOB: [birth year], 51 y.o.   MRN: [id number]  HPI Here for 4 weeks of intermittent episodes of nausea with vomiting, some diarrhea, and spells of feeling hot and faint. There has been no actual syncope. No fevers. She gets these spells once or twice a week. In between she feels fine for a few days. She ha snot seen any blood or black material in the vomitus or stool. No recent foreign travel. She did eat some eggs recently at home that were included in a recent recall of eggs that may have been contaminated with Salmonella bacteria. Her husband ate these eggs also and he feels fine. She has not had much in the way of abdominal pain. She had a colonoscopy last year which was unremarkable. She denies indigestion or heartburn. She asks to be referred to Dr. Richmond Campbell since he works at the same [facility] facility that she does here in [place].    Review of Systems  Constitutional: Positive for appetite change, chills and diaphoresis. Negative for fever.  Respiratory: Negative.   Cardiovascular: Negative.   Gastrointestinal: Positive for diarrhea, nausea and vomiting. Negative for abdominal distention, abdominal pain, anal bleeding, blood in stool and constipation.  Genitourinary: Negative.        Objective:   Physical Exam  Constitutional: She appears well-developed and well-nourished. No distress.  Eyes: No scleral icterus.  Cardiovascular: Normal rate, regular rhythm, normal heart sounds and intact distal pulses.   Pulmonary/Chest: Effort normal and breath sounds normal.  Abdominal: Soft. Bowel sounds are normal. She exhibits no distension and no mass. There is no tenderness. There is no rebound and no guarding.          Assessment & Plan:  She is having intermittent nasuea, vomiting, and diarrhea with some vagal effects as well. I doubt this is form a Salmonella infection but we will order a stool culture to be sure. Get  other labs today. A possible etiology would be duodenal ulcers so she will start on Prilosec OTC daily. Refer to Dr. Earlean Shawl at her request.  Morgan Penna, MD

## 2017-06-05 ENCOUNTER — Encounter: Payer: Self-pay | Admitting: Family Medicine

## 2017-06-05 ENCOUNTER — Ambulatory Visit (INDEPENDENT_AMBULATORY_CARE_PROVIDER_SITE_OTHER): Payer: BLUE CROSS/BLUE SHIELD | Admitting: Family Medicine

## 2017-06-05 ENCOUNTER — Encounter: Payer: Self-pay | Admitting: *Deleted

## 2017-06-05 VITALS — BP 100/80 | HR 60 | Temp 99.0°F | Ht 67.0 in | Wt 188.3 lb

## 2017-06-05 DIAGNOSIS — L03019 Cellulitis of unspecified finger: Secondary | ICD-10-CM | POA: Diagnosis not present

## 2017-06-05 DIAGNOSIS — L0291 Cutaneous abscess, unspecified: Secondary | ICD-10-CM

## 2017-06-05 MED ORDER — DOXYCYCLINE HYCLATE 100 MG PO TABS: 100.0000 mg | ORAL_TABLET | Freq: Two times a day (BID) | ORAL | 0 refills | Status: DC

## 2017-06-05 NOTE — Progress Notes (Signed)
HPI:  Acute visit for R thumb pain: -started 1 week ago -had some redness around medial thumb nail  -soaked it in hydrogen peroxide but has not healed -no fevers, malaise, redness spreading up thumb or hand, numbness, allergies to betadine, alcohol, lidocaine or abx  ROS: See pertinent positives and negatives per HPI.  Past Medical History:  Diagnosis Date  . Allergy    seasonal  . Anemia    History of anemia  . Arthritis    no meds  . Headache(784.0)    OTC excedrin   . Hypertension     Past Surgical History:  Procedure Laterality Date  . ABDOMINAL HYSTERECTOMY  08-10-11   TAH and left SO per Dr. Philis Pique   . COLONOSCOPY  01/12/2016   per Dr. Silverio Decamp, benign polyps, repeat in 10 yrs   . CRYOABLATION     IN oFFICE PER Pt - 1990's  . CYSTOSCOPY  08/10/2011   Procedure: CYSTOSCOPY;  Surgeon: Daria Pastures;  Location: [facility];  Service: Gynecology;;  . DILATION AND EVACUATION      2006  MAB  . svd     1996    Family History  Problem Relation Age of Onset  . Colon polyps Mother   . Clotting disorder Mother   . Colon cancer Paternal Uncle   . Stomach cancer Paternal Uncle   . Esophageal cancer Neg Hx   . Rectal cancer Neg Hx     Social History   Social History  . Marital status: Married    Spouse name: N/A  . Number of children: N/A  . Years of education: N/A   Social History Main Topics  . Smoking status: Never Smoker  . Smokeless tobacco: Never Used  . Alcohol use No  . Drug use: No  . Sexual activity: Yes    Birth control/ protection: Pill   Other Topics Concern  . None   Social History Narrative  . None     Current Outpatient Prescriptions:  .  aspirin-acetaminophen-caffeine (EXCEDRIN MIGRAINE) 250-250-65 MG tablet, Take 1 tablet by mouth every 6 (six) hours as needed for headache. 1-2 tablets as needed, Disp: , Rfl:  .  fexofenadine (ALLEGRA) 180 MG tablet, Take 180 mg by mouth once as needed.  , Disp: , Rfl:  .  ibuprofen (ADVIL,MOTRIN)  200 MG tablet, Take 200 mg by mouth as needed. , Disp: , Rfl:  .  Multiple Vitamin (MULTIVITAMIN) tablet, Take 1 tablet by mouth daily., Disp: , Rfl:  .  triamterene-hydrochlorothiazide (DYAZIDE) 37.5-25 MG capsule, TAKE ONE CAPSULE BY MOUTH EVERY MORNING, Disp: 90 capsule, Rfl: 3 .  valACYclovir (VALTREX) 500 MG tablet, TAKE 1 TABLET(S) EVERY DAY BY ORAL ROUTE. (Patient taking differently: TAKE 1 TABLET(S) EVERY DAY BY ORAL ROUTE as needed), Disp: 90 tablet, Rfl: 3 .  doxycycline (VIBRA-TABS) 100 MG tablet, Take 1 tablet (100 mg total) by mouth 2 (two) times daily., Disp: 10 tablet, Rfl: 0  EXAM:  Vitals:   06/05/17 1640  BP: 100/80  Pulse: 60  Temp: 99 F (37.2 C)    Body mass index is 29.49 kg/m.  GENERAL: vitals reviewed and listed above, alert, oriented, appears well hydrated and in no acute distress  HEENT: atraumatic, conjunttiva clear, no obvious abnormalities on inspection of external nose and ears  NECK: no obvious masses on inspection  LUNGS: clear to auscultation bilaterally, no wheezes, rales or rhonchi, good air movement  CV: HRRR, no peripheral edema  SKIN: paronychia with small abscess  R thumb and small amount surrounding erythema and warmth  MS: moves all extremities without noticeable abnormality  PSYCH: pleasant and cooperative, no obvious depression or anxiety  ASSESSMENT AND PLAN:  Discussed the following assessment and plan:  Paronychia of finger, unspecified laterality  Abscess  Mild cellulitis  -discussed management options and risks and alternatives -she opted for I and D, soaks and abx -f/u recheck in 3 days  Procedure - Abscess I and D: Informed written consent performed, signed and scanned into chart. Time out. Area cleaned with betadine. l% lidocaine without epi injected for local anesthetic. Incised with 11 blade. Small amount of purulent material expressed with complete resolution of apparent abscess. Cleaned with saline. Hemostasis  obtained. Dressing applied.  Tolerated well without complication. Care and return instructions provided.   Patient Instructions  BEFORE YOU LEAVE: -follow up: with Dr. Maudie Mercury in 3 days  Keep clean and dry and out of soil and dishwater  Warm salt water soaks twice daily.  Take the antibiotic as instructed.  Seek care sooner if worsening or not improving.   Colin Benton R., DO

## 2017-06-05 NOTE — Patient Instructions (Addendum)
BEFORE YOU LEAVE: -follow up: with Dr. Maudie Mercury in 3 days  Keep clean and dry and out of soil and dishwater  Warm salt water soaks twice daily.  Take the antibiotic as instructed.  Seek care sooner if worsening or not improving.

## 2017-06-08 ENCOUNTER — Encounter: Payer: Self-pay | Admitting: Family Medicine

## 2017-06-08 ENCOUNTER — Ambulatory Visit (INDEPENDENT_AMBULATORY_CARE_PROVIDER_SITE_OTHER): Payer: BLUE CROSS/BLUE SHIELD | Admitting: Family Medicine

## 2017-06-08 VITALS — BP 118/82 | HR 67 | Temp 98.3°F | Ht 67.0 in | Wt 186.8 lb

## 2017-06-08 DIAGNOSIS — L03019 Cellulitis of unspecified finger: Secondary | ICD-10-CM

## 2017-06-08 DIAGNOSIS — L0291 Cutaneous abscess, unspecified: Secondary | ICD-10-CM

## 2017-06-08 NOTE — Progress Notes (Signed)
HPI:  Follow up paronychia with abscess: -s/p I and D -doing much better -still mild pain but improving -no fevers, malaise, drainage, re-accumulation pus  ROS: See pertinent positives and negatives per HPI.  Past Medical History:  Diagnosis Date  . Allergy    seasonal  . Anemia    History of anemia  . Arthritis    no meds  . Headache(784.0)    OTC excedrin   . Hypertension     Past Surgical History:  Procedure Laterality Date  . ABDOMINAL HYSTERECTOMY  08-10-11   TAH and left SO per Dr. Philis Pique   . COLONOSCOPY  01/12/2016   per Dr. Silverio Decamp, benign polyps, repeat in 10 yrs   . CRYOABLATION     IN oFFICE PER Pt - 1990's  . CYSTOSCOPY  08/10/2011   Procedure: CYSTOSCOPY;  Surgeon: Daria Pastures;  Location: [facility];  Service: Gynecology;;  . DILATION AND EVACUATION      2006  MAB  . svd     1996    Family History  Problem Relation Age of Onset  . Colon polyps Mother   . Clotting disorder Mother   . Colon cancer Paternal Uncle   . Stomach cancer Paternal Uncle   . Esophageal cancer Neg Hx   . Rectal cancer Neg Hx     Social History   Social History  . Marital status: Married    Spouse name: N/A  . Number of children: N/A  . Years of education: N/A   Social History Main Topics  . Smoking status: Never Smoker  . Smokeless tobacco: Never Used  . Alcohol use No  . Drug use: No  . Sexual activity: Yes    Birth control/ protection: Pill   Other Topics Concern  . None   Social History Narrative  . None     Current Outpatient Prescriptions:  .  aspirin-acetaminophen-caffeine (EXCEDRIN MIGRAINE) 250-250-65 MG tablet, Take 1 tablet by mouth every 6 (six) hours as needed for headache. 1-2 tablets as needed, Disp: , Rfl:  .  doxycycline (VIBRA-TABS) 100 MG tablet, Take 1 tablet (100 mg total) by mouth 2 (two) times daily., Disp: 10 tablet, Rfl: 0 .  fexofenadine (ALLEGRA) 180 MG tablet, Take 180 mg by mouth once as needed.  , Disp: , Rfl:  .   ibuprofen (ADVIL,MOTRIN) 200 MG tablet, Take 200 mg by mouth as needed. , Disp: , Rfl:  .  Multiple Vitamin (MULTIVITAMIN) tablet, Take 1 tablet by mouth daily., Disp: , Rfl:  .  triamterene-hydrochlorothiazide (DYAZIDE) 37.5-25 MG capsule, TAKE ONE CAPSULE BY MOUTH EVERY MORNING, Disp: 90 capsule, Rfl: 3 .  valACYclovir (VALTREX) 500 MG tablet, TAKE 1 TABLET(S) EVERY DAY BY ORAL ROUTE. (Patient taking differently: TAKE 1 TABLET(S) EVERY DAY BY ORAL ROUTE as needed), Disp: 90 tablet, Rfl: 3  EXAM:  Vitals:   06/08/17 1644  BP: 118/82  Pulse: 67  Temp: 98.3 F (36.8 C)    Body mass index is 29.26 kg/m.  GENERAL: vitals reviewed and listed above, alert, oriented, appears well hydrated and in no acute distress  HEENT: atraumatic, conjunttiva clear, no obvious abnormalities on inspection of external nose and ears  NECK: no obvious masses on inspection  SKIN: much improved appearance of skin at abscess site, no edema or abscess at this time, mild erythema along nail with mild TTP  MS: moves all extremities without noticeable abnormality  PSYCH: pleasant and cooperative, no obvious depression or anxiety  ASSESSMENT AND PLAN:  Discussed the following assessment and plan:  Paronychia of finger, unspecified laterality  Abscess  -doing well -complete course of abx -continue soaks and top oint for a few more day -Patient advised to return or notify a doctor immediately if symptoms worsen or persist or new concerns arise.  She declined avs  There are no Patient Instructions on file for this visit.  Colin Benton R., DO

## 2017-10-05 ENCOUNTER — Ambulatory Visit (INDEPENDENT_AMBULATORY_CARE_PROVIDER_SITE_OTHER): Payer: BLUE CROSS/BLUE SHIELD | Admitting: *Deleted

## 2017-10-05 DIAGNOSIS — Z23 Encounter for immunization: Secondary | ICD-10-CM | POA: Diagnosis not present

## 2017-10-09 ENCOUNTER — Other Ambulatory Visit: Payer: Self-pay | Admitting: Obstetrics and Gynecology

## 2017-10-09 DIAGNOSIS — Z1231 Encounter for screening mammogram for malignant neoplasm of breast: Secondary | ICD-10-CM

## 2017-11-06 ENCOUNTER — Other Ambulatory Visit: Payer: Self-pay

## 2017-11-13 ENCOUNTER — Ambulatory Visit (INDEPENDENT_AMBULATORY_CARE_PROVIDER_SITE_OTHER): Payer: BLUE CROSS/BLUE SHIELD | Admitting: Family Medicine

## 2017-11-13 ENCOUNTER — Encounter: Payer: Self-pay | Admitting: Family Medicine

## 2017-11-13 VITALS — BP 132/80 | HR 69 | Temp 98.1°F | Ht 68.0 in | Wt 189.6 lb

## 2017-11-13 DIAGNOSIS — Z0000 Encounter for general adult medical examination without abnormal findings: Secondary | ICD-10-CM

## 2017-11-13 LAB — CBC WITH DIFFERENTIAL/PLATELET
BASOS ABS: 0 10*3/uL (ref 0.0–0.1)
Basophils Relative: 0.3 % (ref 0.0–3.0)
EOS PCT: 0.9 % (ref 0.0–5.0)
Eosinophils Absolute: 0 10*3/uL (ref 0.0–0.7)
HCT: 42.7 % (ref 36.0–46.0)
Hemoglobin: 13.6 g/dL (ref 12.0–15.0)
LYMPHS PCT: 50.4 % — AB (ref 12.0–46.0)
Lymphs Abs: 1.9 10*3/uL (ref 0.7–4.0)
MCHC: 31.8 g/dL (ref 30.0–36.0)
MCV: 82.3 fl (ref 78.0–100.0)
MONOS PCT: 6.8 % (ref 3.0–12.0)
Monocytes Absolute: 0.3 10*3/uL (ref 0.1–1.0)
NEUTROS ABS: 1.5 10*3/uL (ref 1.4–7.7)
Neutrophils Relative %: 41.6 % — ABNORMAL LOW (ref 43.0–77.0)
PLATELETS: 266 10*3/uL (ref 150.0–400.0)
RBC: 5.19 Mil/uL — AB (ref 3.87–5.11)
RDW: 16.2 % — ABNORMAL HIGH (ref 11.5–15.5)
WBC: 3.7 10*3/uL — ABNORMAL LOW (ref 4.0–10.5)

## 2017-11-13 LAB — LIPID PANEL
CHOL/HDL RATIO: 4
Cholesterol: 211 mg/dL — ABNORMAL HIGH (ref 0–200)
HDL: 50.6 mg/dL (ref >39.00)
LDL CALC: 153 mg/dL — AB (ref 0–99)
NONHDL: 160.81
TRIGLYCERIDES: 41 mg/dL (ref 0.0–149.0)
VLDL: 8.2 mg/dL (ref 0.0–40.0)

## 2017-11-13 LAB — POC URINALSYSI DIPSTICK (AUTOMATED)
BILIRUBIN UA: NEGATIVE
Blood, UA: NEGATIVE
GLUCOSE UA: NEGATIVE
KETONES UA: NEGATIVE
LEUKOCYTES UA: NEGATIVE
NITRITE UA: NEGATIVE
PH UA: 6 (ref 5.0–8.0)
Spec Grav, UA: 1.02 (ref 1.010–1.025)
Urobilinogen, UA: 1 E.U./dL

## 2017-11-13 LAB — HEPATIC FUNCTION PANEL
ALK PHOS: 57 U/L (ref 39–117)
ALT: 12 U/L (ref 0–35)
AST: 17 U/L (ref 0–37)
Albumin: 3.9 g/dL (ref 3.5–5.2)
BILIRUBIN DIRECT: 0.1 mg/dL (ref 0.0–0.3)
BILIRUBIN TOTAL: 0.4 mg/dL (ref 0.2–1.2)
Total Protein: 6.7 g/dL (ref 6.0–8.3)

## 2017-11-13 LAB — BASIC METABOLIC PANEL
BUN: 12 mg/dL (ref 6–23)
CHLORIDE: 104 meq/L (ref 96–112)
CO2: 31 mEq/L (ref 19–32)
Calcium: 9.2 mg/dL (ref 8.4–10.5)
Creatinine, Ser: 0.88 mg/dL (ref 0.40–1.20)
GFR: 86.36 mL/min (ref >60.00)
Glucose, Bld: 92 mg/dL (ref 70–99)
POTASSIUM: 3.8 meq/L (ref 3.5–5.1)
SODIUM: 141 meq/L (ref 135–145)

## 2017-11-13 LAB — TSH: TSH: 0.9 u[IU]/mL (ref 0.35–4.50)

## 2017-11-13 MED ORDER — VALACYCLOVIR HCL 500 MG PO TABS: ORAL_TABLET | ORAL | 3 refills | Status: DC

## 2017-11-13 MED ORDER — TRIAMTERENE-HCTZ 37.5-25 MG PO CAPS: ORAL_CAPSULE | ORAL | 3 refills | Status: DC

## 2017-11-13 NOTE — Progress Notes (Signed)
   Subjective:    Patient ID: Morgan Doyle, female    DOB: Feb 01, 1966, 51 y.o.   MRN: [id number]  HPI Here for a well exam. She feels well physically but is dealing with a lot of stress, both from work and at home. Her BP is stable.    Review of Systems  Constitutional: Negative.   HENT: Negative.   Eyes: Negative.   Respiratory: Negative.   Cardiovascular: Negative.   Gastrointestinal: Negative.   Genitourinary: Negative for decreased urine volume, difficulty urinating, dyspareunia, dysuria, enuresis, flank pain, frequency, hematuria, pelvic pain and urgency.  Musculoskeletal: Negative.   Skin: Negative.   Neurological: Negative.   Psychiatric/Behavioral: Negative for agitation, behavioral problems, confusion, decreased concentration, dysphoric mood, hallucinations, self-injury, sleep disturbance and suicidal ideas. The patient is nervous/anxious. The patient is not hyperactive.        Objective:   Physical Exam  Constitutional: She is oriented to person, place, and time. She appears well-developed and well-nourished. No distress.  HENT:  Head: Normocephalic and atraumatic.  Right Ear: External ear normal.  Left Ear: External ear normal.  Nose: Nose normal.  Mouth/Throat: Oropharynx is clear and moist. No oropharyngeal exudate.  Eyes: Conjunctivae and EOM are normal. Pupils are equal, round, and reactive to light. No scleral icterus.  Neck: Normal range of motion. Neck supple. No JVD present. No thyromegaly present.  Cardiovascular: Normal rate, regular rhythm, normal heart sounds and intact distal pulses. Exam reveals no gallop and no friction rub.  No murmur heard. Pulmonary/Chest: Effort normal and breath sounds normal. No respiratory distress. She has no wheezes. She has no rales. She exhibits no tenderness.  Abdominal: Soft. Bowel sounds are normal. She exhibits no distension and no mass. There is no tenderness. There is no rebound and no guarding.  Musculoskeletal:  Normal range of motion. She exhibits no edema or tenderness.  Lymphadenopathy:    She has no cervical adenopathy.  Neurological: She is alert and oriented to person, place, and time. She has normal reflexes. No cranial nerve deficit. She exhibits normal muscle tone. Coordination normal.  Skin: Skin is warm and dry. No rash noted. No erythema.  Psychiatric: She has a normal mood and affect. Her behavior is normal. Judgment and thought content normal.          Assessment & Plan:  Well exam. We discussed diet and exercise. Get fasting labs. She has met with Dr. Irven Shelling in the past, and I encouraged her to see her again to discuss the stress in her life.  Alysia Penna, MD

## 2017-11-24 ENCOUNTER — Telehealth: Payer: Self-pay

## 2017-11-24 NOTE — Telephone Encounter (Signed)
Pt advised and voiced understanding.   

## 2017-11-24 NOTE — Telephone Encounter (Signed)
I don't have a favorite brand, but take a B complex vitamin ( it has all the B's in it)

## 2017-11-24 NOTE — Progress Notes (Signed)
Called pt. Spoke with pt. Pt advised of her lab results and voiced understanding. Results have been mailed to pt ask requested.

## 2017-11-24 NOTE — Telephone Encounter (Signed)
Pt wanted me to ask Dr. Sarajane Jews what brand of vitamin B does he recommend for memory loss? Pt would like a call back today.

## 2018-01-04 ENCOUNTER — Ambulatory Visit
Admission: RE | Admit: 2018-01-04 | Discharge: 2018-01-04 | Disposition: A | Discharge status: Home / Self Care | Payer: Managed Care, Other (non HMO) | Source: Ambulatory Visit | Attending: Obstetrics and Gynecology | Admitting: Obstetrics and Gynecology

## 2018-01-04 DIAGNOSIS — Z1231 Encounter for screening mammogram for malignant neoplasm of breast: Secondary | ICD-10-CM

## 2018-04-10 ENCOUNTER — Encounter: Payer: Self-pay | Admitting: Family Medicine

## 2018-04-10 ENCOUNTER — Ambulatory Visit (INDEPENDENT_AMBULATORY_CARE_PROVIDER_SITE_OTHER): Payer: Managed Care, Other (non HMO) | Admitting: Family Medicine

## 2018-04-10 VITALS — BP 138/80 | HR 97 | Temp 99.7°F | Ht 68.0 in | Wt 187.8 lb

## 2018-04-10 DIAGNOSIS — M542 Cervicalgia: Secondary | ICD-10-CM

## 2018-04-10 DIAGNOSIS — R112 Nausea with vomiting, unspecified: Secondary | ICD-10-CM

## 2018-04-10 DIAGNOSIS — A084 Viral intestinal infection, unspecified: Secondary | ICD-10-CM | POA: Diagnosis not present

## 2018-04-10 DIAGNOSIS — R6889 Other general symptoms and signs: Secondary | ICD-10-CM | POA: Diagnosis not present

## 2018-04-10 LAB — POC INFLUENZA A&B (BINAX/QUICKVUE)
Influenza A, POC: NEGATIVE
Influenza B, POC: NEGATIVE

## 2018-04-10 MED ORDER — PROMETHAZINE HCL 25 MG PO TABS: 25.0000 mg | ORAL_TABLET | ORAL | 1 refills | Status: DC | PRN

## 2018-04-10 MED ORDER — CYCLOBENZAPRINE HCL 10 MG PO TABS: 10.0000 mg | ORAL_TABLET | Freq: Three times a day (TID) | ORAL | 1 refills | Status: DC | PRN

## 2018-04-10 MED ORDER — PROMETHAZINE HCL 25 MG/ML IJ SOLN
50.0000 mg | Freq: Once | INTRAMUSCULAR | Status: AC
  Administered 2018-04-10: 50 mg via INTRAMUSCULAR

## 2018-04-10 MED ORDER — METHYLPREDNISOLONE 4 MG PO TBPK: ORAL_TABLET | ORAL | 0 refills | Status: DC

## 2018-04-10 NOTE — Progress Notes (Signed)
   Subjective:    Patient ID: Morgan Doyle, female    DOB: [birth year], 52 y.o.   MRN: [id number]  HPI Here for several issues. First 2 weeks ago she woke up one morning with spasms and sharp pain in the left upper back and neck area. No recent trauma. She has used Ibuprofen but this ha not helped. Then early this morning she developed body aches, a low grade fever, nausea and vomiting, mild stomach cramps, and diarrhea. She has been unable to eat or drink anything today. Her daughter drove her here.    Review of Systems  Constitutional: Negative.   Respiratory: Negative.   Cardiovascular: Negative.   Gastrointestinal: Positive for abdominal pain, diarrhea, nausea and vomiting. Negative for abdominal distention, anal bleeding, blood in stool and constipation.  Genitourinary: Negative.   Musculoskeletal: Positive for neck pain and neck stiffness.  Neurological: Positive for headaches.       Objective:   Physical Exam  Constitutional: She is oriented to person, place, and time.  Ill appearing but alert   Eyes: Conjunctivae are normal. No scleral icterus.  Neck: No thyromegaly present.  Cardiovascular: Normal rate, regular rhythm, normal heart sounds and intact distal pulses.  Pulmonary/Chest: Effort normal and breath sounds normal. No stridor. No respiratory distress. She has no wheezes. She has no rales.  Abdominal: Soft. Bowel sounds are normal. She exhibits no distension and no mass. There is no rebound and no guarding. No hernia.  Mild upper abdominal tenderness   Musculoskeletal:  Tender in the left posterior neck and upper back. Full ROM of the spine   Lymphadenopathy:    She has no cervical adenopathy.  Neurological: She is alert and oriented to person, place, and time.          Assessment & Plan:  She has a viral enteritis, and she also has a pinched nerve in the back. For the virus, she will use Phenergan for nausea and Imodium for diarrhea. Given a shot of Phenergan  here. After she gets home she will drink water and Gatorade as much as she tolerates. For the back, she will use a Medrol dose pack, heat, and Flexeril prn. Written out of work today.  Alysia Penna, MD

## 2018-04-11 ENCOUNTER — Telehealth: Payer: Self-pay | Admitting: Family Medicine

## 2018-04-11 NOTE — Telephone Encounter (Signed)
Called and spoke with pt. Pt plans to come by today to pick up work note. Placed up front

## 2018-04-11 NOTE — Telephone Encounter (Signed)
I have faxed the work note to requested number [phone]   Called and spoke with pt. Pt advised that the Rx has been faxed will keep and call pt to confirm work note was received if not will place up front for pick up.

## 2018-04-11 NOTE — Telephone Encounter (Signed)
Copied from [facility] [id number]. Topic: General - Other >> Apr 11, 2018  7:55 AM Yvette Rack wrote: Reason for CRM: patient states that she need another out of work note for yesterday and today she didn't feel any better today and didn't go to work she will try her best to go into work tomorrow patient states that she would like for out of work note to be faxed to her daughter Heloise Beecham at fax number at work [phone]

## 2018-09-28 ENCOUNTER — Ambulatory Visit: Payer: Managed Care, Other (non HMO) | Admitting: Physician Assistant

## 2018-09-28 ENCOUNTER — Encounter (HOSPITAL_COMMUNITY): Payer: Self-pay

## 2018-09-28 ENCOUNTER — Emergency Department (HOSPITAL_COMMUNITY)
Admission: EM | Admit: 2018-09-28 | Discharge: 2018-09-28 | Disposition: A | Discharge status: Home / Self Care | Payer: Managed Care, Other (non HMO) | Attending: Emergency Medicine | Admitting: Emergency Medicine

## 2018-09-28 ENCOUNTER — Ambulatory Visit: Payer: Self-pay | Admitting: *Deleted

## 2018-09-28 DIAGNOSIS — E785 Hyperlipidemia, unspecified: Secondary | ICD-10-CM | POA: Diagnosis not present

## 2018-09-28 DIAGNOSIS — Z79899 Other long term (current) drug therapy: Secondary | ICD-10-CM | POA: Insufficient documentation

## 2018-09-28 DIAGNOSIS — I10 Essential (primary) hypertension: Secondary | ICD-10-CM | POA: Diagnosis not present

## 2018-09-28 DIAGNOSIS — Z91040 Latex allergy status: Secondary | ICD-10-CM | POA: Diagnosis not present

## 2018-09-28 DIAGNOSIS — R51 Headache: Secondary | ICD-10-CM | POA: Insufficient documentation

## 2018-09-28 DIAGNOSIS — G44209 Tension-type headache, unspecified, not intractable: Secondary | ICD-10-CM

## 2018-09-28 LAB — BASIC METABOLIC PANEL
Anion gap: 7 (ref 5–15)
BUN: 9 mg/dL (ref 6–20)
CO2: 27 mmol/L (ref 22–32)
CREATININE: 1.02 mg/dL — AB (ref 0.44–1.00)
Calcium: 9.9 mg/dL (ref 8.9–10.3)
Chloride: 105 mmol/L (ref 98–111)
GFR calc non Af Amer: >60 mL/min (ref >60)
Glucose, Bld: 93 mg/dL (ref 70–99)
POTASSIUM: 3.3 mmol/L — AB (ref 3.5–5.1)
Sodium: 139 mmol/L (ref 135–145)

## 2018-09-28 MED ORDER — ACETAMINOPHEN 325 MG PO TABS
650.0000 mg | ORAL_TABLET | Freq: Once | ORAL | Status: AC
  Administered 2018-09-28: 650 mg via ORAL
  Filled 2018-09-28: qty 2

## 2018-09-28 MED ORDER — HYDROCHLOROTHIAZIDE 25 MG PO TABS
25.0000 mg | ORAL_TABLET | Freq: Once | ORAL | Status: AC
  Administered 2018-09-28: 25 mg via ORAL
  Filled 2018-09-28: qty 1

## 2018-09-28 NOTE — Discharge Instructions (Signed)
Please read and follow all provided instructions.  Your diagnoses today include:  1. Essential hypertension   2. Acute non intractable tension-type headache     Tests performed today include:  Kidney function  Vital signs. See below for your results today.   Medications:  You are given an extra dose of your blood pressure medicine HCTZ today.  Take as directed by your doctor starting tomorrow.  Take any prescribed medications only as directed.  Additional information:  Follow any educational materials contained in this packet.  You are having a headache. No specific cause was found today for your headache. It may have been a migraine or other cause of headache. Stress, anxiety, fatigue, and depression are common triggers for headaches.   Your headache today does not appear to be life-threatening or require hospitalization, but often the exact cause of headaches is not determined in the emergency department. Therefore, follow-up with your doctor is very important to find out what may have caused your headache and whether or not you need any further diagnostic testing or treatment.   Sometimes headaches can appear benign (not harmful), but then more serious symptoms can develop which should prompt an immediate re-evaluation by your doctor or the emergency department.  BE VERY CAREFUL not to take multiple medicines containing Tylenol (also called acetaminophen). Doing so can lead to an overdose which can damage your liver and cause liver failure and possibly death.   Follow-up instructions: Please follow-up with your primary care provider in the next 7 days for further evaluation of your symptoms.   Return instructions:   Please return to the Emergency Department if you experience worsening symptoms.  Return if the medications do not resolve your headache, if it recurs, or if you have multiple episodes of vomiting or cannot keep down fluids.  Return if you have a change from the  usual headache.  RETURN IMMEDIATELY IF you:  Develop a sudden, severe headache  Develop confusion or become poorly responsive or faint  Develop a fever above 100.36F or problem breathing  Have a change in speech, vision, swallowing, or understanding  Develop new weakness, numbness, tingling, incoordination in your arms or legs  Have a seizure  Please return if you have any other emergent concerns.  Additional Information:  Your vital signs today were: BP (!) 155/113 (BP Location: Right Arm)    Pulse 68    Temp 98.1 F (36.7 C) (Oral)    Resp 16    LMP 05/10/2011    SpO2 100%  If your blood pressure (BP) was elevated above 135/85 this visit, please have this repeated by your doctor within one month. --------------

## 2018-09-28 NOTE — ED Triage Notes (Signed)
Patient was at GI appt and they found her BP to be elevated. She states that she has had vague headache for several days and taking her BP meds daily as prescribed. Alert and oriented.l

## 2018-09-28 NOTE — Telephone Encounter (Signed)
Per Aldona Bar and Dr. Jerline Pain patient should go to the ER. I spoke with the patient to let her know that the doctor would like her to go to the Er for further work up. Patient stated that she was not happy about this. I explained to the patient per Aldona Bar that she could be having a stroke. Patient ask if she could just go back and get her BP checked again. I explained that the doctor here thinks that she should be seen in the ER. I told her that she would need to go to [facility] due to the cardiac work up. Patient verbalized understanding.

## 2018-09-28 NOTE — Telephone Encounter (Signed)
Patient called back and she was asking if she could go to the urgent care instead of going to the ER I explained that the doctor wants her to go to the ER. She said that it is just going to make her BP and headache worse. I explained again that the best place was for her to go to the ER. Patient did not say that she if she was going to urgent care or the ER.

## 2018-09-28 NOTE — Telephone Encounter (Signed)
Pt called from Dr. Liliane Channel office (GI) with an elevated BP.  192/117.   They instructed her to call her PCP right away after they checked it 6 times and it was very elevated every time.   She is also c/o headache.   She sees Dr. Sarajane Jews at [place].  The [facility] is fully booked today.  I checked with the flow coordinator too.   She suggested trying [facility] which I did and got an appt with Inda Coke, PA for today at 12:40.  Pt was agreeable to this plan.    Reason for Disposition . Systolic BP  >= 309 OR Diastolic >= 407  Answer Assessment - Initial Assessment Questions 1. BLOOD PRESSURE: "What is the blood pressure?" "Did you take at least two measurements 5 minutes apart?"     Pt at a doctor's now.  Bp 192/117.   Dr. Earlean Shawl.   GI doctor.  Has headache. 2. ONSET: "When did you take your blood pressure?"     They told me to call my PCP regarding my BP being so high.   3. HOW: "How did you obtain the blood pressure?" (e.g., visiting nurse, automatic home BP monitor)     They checked it 6 times and it was high every time. 4. HISTORY: "Do you have a history of high blood pressure?"     Yes.  5. MEDICATIONS: "Are you taking any medications for blood pressure?" "Have you missed any doses recently?"     Yes  I do take my BP pills every morning. 6. OTHER SYMPTOMS: "Do you have any symptoms?" (e.g., headache, chest pain, blurred vision, difficulty breathing, weakness)     My head has been hurting since last night.   My eyes are blurred. 7. PREGNANCY: "Is there any chance you are pregnant?" "When was your last menstrual period?"     Not asked  Protocols used: HIGH BLOOD PRESSURE-A-AH

## 2018-09-28 NOTE — Telephone Encounter (Signed)
See note

## 2018-09-28 NOTE — ED Notes (Signed)
Pt states that she is frustrated, states that she wants to leave. Reassessed her vitals B/P still high, explained that it would be better if she'd stayed.

## 2018-09-28 NOTE — ED Provider Notes (Signed)
[facility] DEPARTMENT Provider Note   CSN: [id number] Arrival date & time: 09/28/18  1247     History   Chief Complaint Chief Complaint  Patient presents with  . Hypertension    HPI Morgan Doyle is a 52 y.o. female.  Patient presents the emergency department with complaint of headache and high blood pressure.  Patient went to see her GI physician today for "abdominal problems".  She was found to have a frontal headache there with blood pressures in the 527-782 systolic range.  She was encouraged to make an appointment with her primary care doctor.  Upon calling them, she was called and told to go to the emergency department because they could not ensure that she was not having a stroke.  Patient reports that the headache started last night.  It was gradual onset.  She reports being stressed at work.  She has not taken anything today for her headache.  She did take her blood pressure medication this morning.  She reports some associated blurry vision but no black spots or blind spots in her vision or any loss of vision.  She denies any neck pain or trouble moving her neck.  Patient denies any chest pain or shortness of breath.  No history of kidney problems.  She does have left lower extremity swelling stemming from contusion sustained on her left knee when she struck it on an object. The onset of this condition was gradual. The course is constant. Aggravating factors: none. Alleviating factors: none.       Past Medical History:  Diagnosis Date  . Allergy    seasonal  . Anemia    History of anemia  . Arthritis    no meds  . Headache(784.0)    OTC excedrin   . Hypertension     Patient Active Problem List   Diagnosis Date Noted  . Low back pain 07/21/2014  . Mononucleosis 05/11/2012  . HYPERLIPIDEMIA 01/15/2010  . HYPERTENSION 01/15/2010  . ALLERGIC RHINITIS 01/15/2010  . OSTEOARTHRITIS 01/15/2010  . HEADACHE 01/15/2010    Past Surgical  History:  Procedure Laterality Date  . ABDOMINAL HYSTERECTOMY  08-10-11   TAH and left SO per Dr. Philis Pique   . COLONOSCOPY  01/12/2016   per Dr. Silverio Decamp, benign polyps, repeat in 10 yrs   . CRYOABLATION     IN oFFICE PER Pt - 1990's  . CYSTOSCOPY  08/10/2011   Procedure: CYSTOSCOPY;  Surgeon: Daria Pastures;  Location: [facility];  Service: Gynecology;;  . DILATION AND EVACUATION      2006  [facility]  . svd     1996     OB History   None      Home Medications    Prior to Admission medications   Medication Sig Start Date End Date Taking? Authorizing Provider  aspirin-acetaminophen-caffeine (EXCEDRIN MIGRAINE) [id number] MG tablet Take 1 tablet by mouth every 6 (six) hours as needed for headache. 1-2 tablets as needed     [provider]  cyclobenzaprine (FLEXERIL) 10 MG tablet Take 1 tablet (10 mg total) by mouth 3 (three) times daily as needed for muscle spasms. 04/10/18   Laurey Morale, MD  fexofenadine (ALLEGRA) 180 MG tablet Take 180 mg by mouth once as needed.   08/04/11   [provider]  ibuprofen (ADVIL,MOTRIN) 200 MG tablet Take 200 mg by mouth as needed.     [provider]  methylPREDNISolone (MEDROL DOSEPAK) 4 MG TBPK tablet As  directed 04/10/18   Laurey Morale, MD  Multiple Vitamin (MULTIVITAMIN) tablet Take 1 tablet by mouth daily.    [provider]  omeprazole (PRILOSEC) 20 MG capsule Take 20 mg by mouth daily.    [provider]  promethazine (PHENERGAN) 25 MG tablet Take 1 tablet (25 mg total) by mouth every 4 (four) hours as needed for nausea or vomiting. 04/10/18   Laurey Morale, MD  triamterene-hydrochlorothiazide (DYAZIDE) 37.5-25 MG capsule TAKE ONE CAPSULE BY MOUTH EVERY MORNING 11/13/17   Laurey Morale, MD  valACYclovir (VALTREX) 500 MG tablet TAKE 1 TABLET(S) EVERY DAY BY ORAL ROUTE as needed 11/13/17   Laurey Morale, MD    Family History Family History  Problem Relation Age of Onset  . Colon polyps Mother   .  Clotting disorder Mother   . Colon cancer Paternal Uncle   . Stomach cancer Paternal Uncle   . Breast cancer Paternal Aunt   . Esophageal cancer Neg Hx   . Rectal cancer Neg Hx     Social History Social History   Tobacco Use  . Smoking status: Never Smoker  . Smokeless tobacco: Never Used  Substance Use Topics  . Alcohol use: No    Alcohol/week: 0.0 standard drinks  . Drug use: No     Allergies   Codeine; Other; Pollen extract; Tree extract; and Latex   Review of Systems Review of Systems  Constitutional: Negative for diaphoresis and fever.  Eyes: Positive for visual disturbance (blurry vision). Negative for redness.  Respiratory: Negative for cough and shortness of breath.   Cardiovascular: Positive for leg swelling. Negative for chest pain and palpitations.  Gastrointestinal: Negative for abdominal pain, nausea and vomiting.  Genitourinary: Negative for dysuria.  Musculoskeletal: Negative for back pain and neck pain.  Skin: Negative for rash.  Neurological: Positive for headaches. Negative for syncope, light-headedness and numbness.  Psychiatric/Behavioral: The patient is not nervous/anxious.      Physical Exam Updated Vital Signs BP (!) 155/113 (BP Location: Right Arm)   Pulse 68   Temp 98.1 F (36.7 C) (Oral)   Resp 16   LMP 05/10/2011   SpO2 100%   Physical Exam  Constitutional: She is oriented to person, place, and time. She appears well-developed and well-nourished.  HENT:  Head: Normocephalic and atraumatic.  Right Ear: Tympanic membrane, external ear and ear canal normal.  Left Ear: Tympanic membrane, external ear and ear canal normal.  Nose: Nose normal.  Mouth/Throat: Uvula is midline, oropharynx is clear and moist and mucous membranes are normal. Mucous membranes are not dry.  Eyes: Pupils are equal, round, and reactive to light. Conjunctivae, EOM and lids are normal. Right eye exhibits no nystagmus. Left eye exhibits no nystagmus.  Neck: Trachea  normal and normal range of motion. Neck supple. Normal carotid pulses and no JVD present. No muscular tenderness present. Carotid bruit is not present. No tracheal deviation present.  Cardiovascular: Normal rate, regular rhythm, S1 normal, S2 normal, normal heart sounds and intact distal pulses. Exam reveals no decreased pulses.  No murmur heard. Pulmonary/Chest: Effort normal and breath sounds normal. No respiratory distress. She has no wheezes. She exhibits no tenderness.  Abdominal: Soft. Normal aorta and bowel sounds are normal. There is no tenderness. There is no rebound and no guarding.  Musculoskeletal: Normal range of motion.       Cervical back: She exhibits normal range of motion, no tenderness and no bony tenderness.  Neurological: She is alert and oriented  to person, place, and time. She has normal strength and normal reflexes. No cranial nerve deficit or sensory deficit. She displays a negative Romberg sign. Coordination and gait normal. GCS eye subscore is 4. GCS verbal subscore is 5. GCS motor subscore is 6.  Skin: Skin is warm and dry. She is not diaphoretic. No cyanosis. No pallor.  Psychiatric: She has a normal mood and affect.  Nursing note and vitals reviewed.    ED Treatments / Results  Labs (all labs ordered are listed, but only abnormal results are displayed) Labs Reviewed  BASIC METABOLIC PANEL - Abnormal; Notable for the following components:      Result Value   Potassium 3.3 (*)    Creatinine, Ser 1.02 (*)    All other components within normal limits    EKG None  Radiology No results found.  Procedures Procedures (including critical care time)  Medications Ordered in ED Medications - No data to display   Initial Impression / Assessment and Plan / ED Course  I have reviewed the triage vital signs and the nursing notes.  Pertinent labs & imaging results that were available during my care of the patient were reviewed by me and considered in my medical  decision making (see chart for details).     Patient seen and examined.  Reviewed i-STAT.  Vital signs reviewed and are as follows: BP (!) 155/113 (BP Location: Right Arm)   Pulse 68   Temp 98.1 F (36.7 C) (Oral)   Resp 16   LMP 05/10/2011   SpO2 100%   Patient does not have any concerning symptoms of stroke at this point.  We discussed treatment of her headache.  We will give additional dose of HCTZ and give Tylenol for headache.  Patient does well, anticipate she can be discharged home.  I do not feel that she requires head CT at this point.  No signs of thunderclap headache or any kind of neurologic deficits.  She does not have any meningeal irritation on exam. I have low concern for bleed.  3:46 PM patient's blood pressure is stable.  She has received additional 25 mg HCTZ.  She is comfortable discharged home at this time.  Encourage PCP follow-up in 1 week for blood pressure recheck.  She will rest tonight.  Patient counseled to return if they have severe headache, weakness in their arms or legs, slurred speech, trouble walking or talking, confusion, trouble with their balance, neck stiffness or significant pain, or if they have any other concerns. Patient verbalizes understanding and agrees with plan.    Final Clinical Impressions(s) / ED Diagnoses   Final diagnoses:  Essential hypertension  Acute non intractable tension-type headache   Patient with headache in setting of high blood pressure. Patient without high-risk features of headache including: sudden onset/thunderclap HA, no similar headache in past, altered mental status, accompanying seizure, headache with exertion, history of immunocompromise, neck or shoulder pain, fever, use of anticoagulation, family history of spontaneous SAH, concomitant drug use, toxic exposure.   Patient has a normal complete neurological exam, normal vital signs, normal level of consciousness, no signs of meningismus, is  well-appearing/non-toxic appearing, no signs of trauma.   Imaging with CT/MRI not indicated given history and physical exam findings.   No dangerous or life-threatening conditions suspected or identified by history, physical exam, and by work-up. No indications for hospitalization identified.   HTN: Mildly elevated. Creatinine OK. No neuro sx. No vision loss. No CP, SOB. Continue current home regimen with  PCP follow-up.   ED Discharge Orders    None       Carlisle Cater, Hershal Coria 09/28/18 1643    Mesner, Corene Cornea, MD 09/29/18 [phone]

## 2018-10-03 ENCOUNTER — Encounter: Payer: Self-pay | Admitting: *Deleted

## 2018-10-03 ENCOUNTER — Ambulatory Visit (INDEPENDENT_AMBULATORY_CARE_PROVIDER_SITE_OTHER): Payer: Managed Care, Other (non HMO) | Admitting: Family Medicine

## 2018-10-03 ENCOUNTER — Encounter: Payer: Self-pay | Admitting: Family Medicine

## 2018-10-03 VITALS — BP 128/84 | HR 88 | Temp 98.8°F | Wt 189.2 lb

## 2018-10-03 DIAGNOSIS — I10 Essential (primary) hypertension: Secondary | ICD-10-CM

## 2018-10-03 DIAGNOSIS — M79605 Pain in left leg: Secondary | ICD-10-CM

## 2018-10-03 MED ORDER — AMLODIPINE BESYLATE 5 MG PO TABS: 5.0000 mg | ORAL_TABLET | Freq: Every day | ORAL | 2 refills | Status: DC

## 2018-10-03 NOTE — Progress Notes (Signed)
   Subjective:    Patient ID: Morgan Doyle, female    DOB: Jun 07, 1966, 52 y.o.   MRN: [id number]  HPI Here to follow up an ER visit on 09-28-18 for very high BP, headache, and dizziness. In the GI office that morning her BP was 192/117. In the ER it was 155/113. No chest pain or SOB. In the ER her labs looked fine and her exam was unremarkable. She was given an extra HCTZ and was sent home. Since then she has felt better with only a slight headache. She has not checked her BP at home. She does however mention a week of intermittent aching pains with swelling in the left leg, especially the thigh and calf. No foot swelling. No recent trauma.    Review of Systems  Constitutional: Negative.   HENT: Negative.   Eyes: Negative.   Respiratory: Negative.   Cardiovascular: Positive for leg swelling. Negative for chest pain and palpitations.  Musculoskeletal: Positive for myalgias.  Neurological: Positive for headaches.       Objective:   Physical Exam  Constitutional: She appears well-developed and well-nourished.  Cardiovascular: Normal rate, regular rhythm, normal heart sounds and intact distal pulses.  Pulmonary/Chest: Effort normal and breath sounds normal. No stridor. No respiratory distress. She has no wheezes. She has no rales.  Musculoskeletal: She exhibits no edema.  The left leg is normal on exam           Assessment & Plan:  For the HTN, we will add Amlodipine 5 mg daily to the Dyazide. She will check her BP twice daily at home and report back to [place] in 3 weeks. We will set up a venous doppler to rule out a DVT in the left leg. Written out of work today. Alysia Penna, MD

## 2018-10-05 ENCOUNTER — Ambulatory Visit (HOSPITAL_COMMUNITY)
Admission: RE | Admit: 2018-10-05 | Discharge: 2018-10-05 | Disposition: A | Discharge status: Home / Self Care | Payer: Managed Care, Other (non HMO) | Source: Ambulatory Visit | Attending: Cardiology | Admitting: Cardiology

## 2018-10-05 DIAGNOSIS — M79605 Pain in left leg: Secondary | ICD-10-CM | POA: Insufficient documentation

## 2018-10-16 ENCOUNTER — Telehealth: Payer: Self-pay | Admitting: *Deleted

## 2018-10-16 NOTE — Telephone Encounter (Signed)
Patient is aware of result. Patient is having left leg pain and her knee is swollen. She has tried OTC with no relief. Please advise.  Dr. Sarajane Jews please advise. Thanks

## 2018-10-16 NOTE — Telephone Encounter (Signed)
Copied from [place] [id number]. Topic: Quick Communication - Office Called Patient (Clinic Use ONLY) >> Oct 16, 2018 10:25 AM Lennox Solders wrote: Reason for CRM: pt is returning lee call concerning venous doppler results

## 2018-10-18 NOTE — Telephone Encounter (Signed)
Pt called to check status. Please advise.  °

## 2018-10-19 NOTE — Telephone Encounter (Signed)
She can use Tramadol 50 mg for pain. Take 2 every 6 hours prn pain. Call in #60 with 2 rf. I will also refer her to Orthopedics to evaluate.

## 2018-10-19 NOTE — Addendum Note (Signed)
Addended by: Alysia Penna A on: 10/19/2018 12:49 PM   Modules accepted: Orders

## 2018-10-19 NOTE — Telephone Encounter (Signed)
Left message on machine for patient to return our call CRM 

## 2018-10-30 ENCOUNTER — Ambulatory Visit (INDEPENDENT_AMBULATORY_CARE_PROVIDER_SITE_OTHER): Payer: Self-pay | Admitting: Family Medicine

## 2018-10-30 MED ORDER — TRAMADOL HCL 50 MG PO TABS: ORAL_TABLET | ORAL | 2 refills | Status: DC

## 2018-10-30 NOTE — Telephone Encounter (Signed)
Shenette with [facility] called stating phone in on Tramadol did not have the strength. Please call in new RX.

## 2018-10-30 NOTE — Telephone Encounter (Signed)
Called and clarified  prescription of Tramadol with pharmacist.

## 2018-10-30 NOTE — Telephone Encounter (Signed)
Spoke with patient.  rx sent.  Appointment for ortho is scheduled.

## 2018-10-31 ENCOUNTER — Ambulatory Visit (INDEPENDENT_AMBULATORY_CARE_PROVIDER_SITE_OTHER): Payer: Self-pay | Admitting: Family Medicine

## 2018-11-06 ENCOUNTER — Encounter (INDEPENDENT_AMBULATORY_CARE_PROVIDER_SITE_OTHER): Payer: Self-pay | Admitting: Family Medicine

## 2018-11-06 ENCOUNTER — Ambulatory Visit (INDEPENDENT_AMBULATORY_CARE_PROVIDER_SITE_OTHER): Payer: Managed Care, Other (non HMO) | Admitting: Family Medicine

## 2018-11-06 DIAGNOSIS — M25512 Pain in left shoulder: Secondary | ICD-10-CM | POA: Diagnosis not present

## 2018-11-06 DIAGNOSIS — M79605 Pain in left leg: Secondary | ICD-10-CM | POA: Diagnosis not present

## 2018-11-06 MED ORDER — METHOCARBAMOL 750 MG PO TABS: 750.0000 mg | ORAL_TABLET | Freq: Four times a day (QID) | ORAL | 1 refills | Status: DC | PRN

## 2018-11-06 MED ORDER — PREDNISONE 10 MG PO TABS: ORAL_TABLET | ORAL | 0 refills | Status: DC

## 2018-11-06 NOTE — Progress Notes (Signed)
  NORRINE BALLESTER - 52 y.o. female MRN [id number]  Date of birth: 1965/12/18    SUBJECTIVE:      Chief Complaint:/ HPI:  52 year old female with left knee pain and left shoulder pain.  Patient reports on 09/18/2018, she hit the front of her knee off her desk at work.  She reports swelling in bruising over the patella at that time.  This improved with ice and rest.  She reports walking since then and subsequently developing pain in the posterior knee/hamstring.  Then, around the beginning of November she again developed pain in the left shoulder region.  This pain she describes as a spasm that radiates to the neck and down to the shoulder.  She denies any specific injury to the shoulder.  She has been taking only Tylenol for her pain due to elevated blood pressure.  This is only somewhat helpful.  She denies any distal numbness or tingling in upper or lower extremity.  No significant weakness reported.  No associated skin changes.   ROS:     See HPI  PERTINENT  PMH / PSH FH / / SH:  Past Medical, Surgical, Social, and Family History Reviewed & Updated in the EMR.  Pertinent findings include:  Uncontrolled hypertension  OBJECTIVE: LMP 05/10/2011   Physical Exam:  Vital signs are reviewed.  GEN: Alert and oriented, NAD Pulm: Breathing unlabored PSY: normal mood, congruent affect  MSK: Left knee: - Inspection: no gross deformity.  Slight amount soft tissue swelling overlying the patella.  No erythema or bruising noted. - Palpation: Mild tenderness over the patella.  There is tenderness over the semimembranosus and tendinosis as well as the biceps femoris posteriorly. - ROM: full active ROM with flexion and extension in knee and hip - Strength: 5/5 strength.  Patient reports pain in the hamstring with resisted knee flexion. - Neuro/vasc: NV intact - Special Tests: - LIGAMENTS: negative anterior and posterior drawer, negative Lachman's, no MCL or LCL laxity  -- MENISCUS: negative  McMurray's -- PF JOINT: nml patellar mobility without apprehension or pain  Left shoulder: No obvious deformity or asymmetry. No bruising. No swelling Tenderness over the superior left trapezius.  No tenderness over the midline cervical spine.  No AC joint tenderness Full ROM in flexion, abduction, internal/external rotation.  Mild generalized discomfort in the shoulder with range of motion NV intact distally Special Tests:  - Impingement: Neg Hawkins and Neers.  - Supraspinatous: Negative empty can.  5/5 strength - Infraspinatous/Teres: 5/5 strength with ER - Subscapularis: 5/5 strength with IR  Cervical spine: Full range of motion of the cervical spine.  Patient reports tightness in the left trapezius with rotation to the right. Negative Spurling's Sensation intact in the bilateral upper extremities.  No focal weakness.   ASSESSMENT & PLAN:  1.  Left hamstring strain-patient's knee exam is unremarkable.  There are no findings of ligamentous instability. 2.  Left patella contusion 3.  Left trapezius strain-pain shoulder pain is due to spasm of the trapezius.  The exam of the shoulder itself is not concerning for rotator cuff pathology or impingement.  We wil treat both of these with prednisone taper.  Robaxin at night for muscle spasm.  Referral to physical therapy.  Patient will follow-up as needed

## 2018-11-06 NOTE — Progress Notes (Signed)
I saw and examined the patient with Dr. Okey Dupre and agree with assessment and plan as outlined.  We will treat as myofascial left shoulder pain with physical therapy.  She also has prominence of her right sternoclavicular joint which will be treated as well.  Prednisone taper and Robaxin.  Modalities for her left hamstring.  She does have a family history of gout but no personal history.  Consider additional testing if symptoms persist.

## 2018-11-14 ENCOUNTER — Encounter: Payer: Self-pay | Admitting: Family Medicine

## 2018-11-16 ENCOUNTER — Encounter: Payer: Self-pay | Admitting: Family Medicine

## 2018-11-16 ENCOUNTER — Ambulatory Visit (INDEPENDENT_AMBULATORY_CARE_PROVIDER_SITE_OTHER): Payer: Managed Care, Other (non HMO) | Admitting: Family Medicine

## 2018-11-16 VITALS — BP 120/70 | HR 73 | Temp 97.8°F | Ht 68.0 in | Wt 188.8 lb

## 2018-11-16 DIAGNOSIS — Z0000 Encounter for general adult medical examination without abnormal findings: Secondary | ICD-10-CM

## 2018-11-16 LAB — CBC WITH DIFFERENTIAL/PLATELET
BASOS ABS: 0 10*3/uL (ref 0.0–0.1)
Basophils Relative: 0.1 % (ref 0.0–3.0)
EOS ABS: 0 10*3/uL (ref 0.0–0.7)
Eosinophils Relative: 0.5 % (ref 0.0–5.0)
HCT: 43.9 % (ref 36.0–46.0)
HEMOGLOBIN: 14.2 g/dL (ref 12.0–15.0)
Lymphocytes Relative: 49.8 % — ABNORMAL HIGH (ref 12.0–46.0)
Lymphs Abs: 3.4 10*3/uL (ref 0.7–4.0)
MCHC: 32.4 g/dL (ref 30.0–36.0)
MCV: 80.5 fl (ref 78.0–100.0)
MONO ABS: 0.4 10*3/uL (ref 0.1–1.0)
Monocytes Relative: 6.2 % (ref 3.0–12.0)
Neutro Abs: 3 10*3/uL (ref 1.4–7.7)
Neutrophils Relative %: 43.4 % (ref 43.0–77.0)
Platelets: 294 10*3/uL (ref 150.0–400.0)
RBC: 5.45 Mil/uL — AB (ref 3.87–5.11)
RDW: 15.4 % (ref 11.5–15.5)
WBC: 6.9 10*3/uL (ref 4.0–10.5)

## 2018-11-16 LAB — POC URINALSYSI DIPSTICK (AUTOMATED)
Bilirubin, UA: NEGATIVE
Glucose, UA: NEGATIVE
Ketones, UA: NEGATIVE
LEUKOCYTES UA: NEGATIVE
NITRITE UA: NEGATIVE
Protein, UA: NEGATIVE
RBC UA: NEGATIVE
SPEC GRAV UA: 1.015 (ref 1.010–1.025)
UROBILINOGEN UA: 0.2 U/dL
pH, UA: 8.5 — AB (ref 5.0–8.0)

## 2018-11-16 LAB — BASIC METABOLIC PANEL
BUN: 13 mg/dL (ref 6–23)
CALCIUM: 9.6 mg/dL (ref 8.4–10.5)
CHLORIDE: 100 meq/L (ref 96–112)
CO2: 34 mEq/L — ABNORMAL HIGH (ref 19–32)
CREATININE: 1.08 mg/dL (ref 0.40–1.20)
GFR: 67.92 mL/min (ref >60.00)
Glucose, Bld: 90 mg/dL (ref 70–99)
Potassium: 4 mEq/L (ref 3.5–5.1)
Sodium: 140 mEq/L (ref 135–145)

## 2018-11-16 LAB — HEPATIC FUNCTION PANEL
ALT: 12 U/L (ref 0–35)
AST: 12 U/L (ref 0–37)
Albumin: 3.9 g/dL (ref 3.5–5.2)
Alkaline Phosphatase: 53 U/L (ref 39–117)
BILIRUBIN DIRECT: 0.1 mg/dL (ref 0.0–0.3)
BILIRUBIN TOTAL: 0.5 mg/dL (ref 0.2–1.2)
Total Protein: 6.8 g/dL (ref 6.0–8.3)

## 2018-11-16 LAB — LIPID PANEL
CHOL/HDL RATIO: 4
Cholesterol: 208 mg/dL — ABNORMAL HIGH (ref 0–200)
HDL: 58 mg/dL (ref >39.00)
LDL Cholesterol: 134 mg/dL — ABNORMAL HIGH (ref 0–99)
NONHDL: 150.09
Triglycerides: 80 mg/dL (ref 0.0–149.0)
VLDL: 16 mg/dL (ref 0.0–40.0)

## 2018-11-16 LAB — TSH: TSH: 1.38 u[IU]/mL (ref 0.35–4.50)

## 2018-11-16 MED ORDER — VALACYCLOVIR HCL 500 MG PO TABS: ORAL_TABLET | ORAL | 3 refills | Status: DC

## 2018-11-16 MED ORDER — TRIAMTERENE-HCTZ 37.5-25 MG PO CAPS: ORAL_CAPSULE | ORAL | 3 refills | Status: DC

## 2018-11-16 NOTE — Progress Notes (Signed)
   Subjective:    Patient ID: Morgan Doyle, female    DOB: 1966-03-01, 52 y.o.   MRN: [id number]  HPI Here for a well exam. We saw her for left leg pain a few weeks ago and a venous doppler was negative for DVT. She saw Dr. Junius Roads in Orthopedics and he felt she had a hamstring strain. He prescribed prednisone and PT. She is feeling better now. Also since we added Amlodipine to her regimen she has felt better and her BP is stable in the 120s over 70s at home.    Review of Systems  Constitutional: Negative.   HENT: Negative.   Eyes: Negative.   Respiratory: Negative.   Cardiovascular: Negative.   Gastrointestinal: Negative.   Genitourinary: Negative for decreased urine volume, difficulty urinating, dyspareunia, dysuria, enuresis, flank pain, frequency, hematuria, pelvic pain and urgency.  Musculoskeletal: Negative.   Skin: Negative.   Neurological: Negative.   Psychiatric/Behavioral: Negative.        Objective:   Physical Exam  Constitutional: She is oriented to person, place, and time. She appears well-developed and well-nourished. No distress.  HENT:  Head: Normocephalic and atraumatic.  Right Ear: External ear normal.  Left Ear: External ear normal.  Nose: Nose normal.  Mouth/Throat: Oropharynx is clear and moist. No oropharyngeal exudate.  Eyes: Pupils are equal, round, and reactive to light. Conjunctivae and EOM are normal. No scleral icterus.  Neck: Normal range of motion. Neck supple. No JVD present. No thyromegaly present.  Cardiovascular: Normal rate, regular rhythm, normal heart sounds and intact distal pulses. Exam reveals no gallop and no friction rub.  No murmur heard. Pulmonary/Chest: Effort normal and breath sounds normal. No respiratory distress. She has no wheezes. She has no rales. She exhibits no tenderness.  Abdominal: Soft. Bowel sounds are normal. She exhibits no distension and no mass. There is no tenderness. There is no rebound and no guarding.    Musculoskeletal: Normal range of motion. She exhibits no edema or tenderness.  Lymphadenopathy:    She has no cervical adenopathy.  Neurological: She is alert and oriented to person, place, and time. She has normal reflexes. She displays normal reflexes. No cranial nerve deficit. She exhibits normal muscle tone. Coordination normal.  Skin: Skin is warm and dry. No rash noted. No erythema.  Psychiatric: She has a normal mood and affect. Her behavior is normal. Judgment and thought content normal.          Assessment & Plan:  Well exam. We discussed diet and exercise. Get fasting labs.  Morgan Penna, MD

## 2018-12-24 ENCOUNTER — Other Ambulatory Visit: Payer: Self-pay | Admitting: Family Medicine

## 2019-01-14 ENCOUNTER — Other Ambulatory Visit: Payer: Self-pay | Admitting: Obstetrics and Gynecology

## 2019-01-14 DIAGNOSIS — Z1231 Encounter for screening mammogram for malignant neoplasm of breast: Secondary | ICD-10-CM

## 2019-02-15 ENCOUNTER — Ambulatory Visit: Payer: Self-pay

## 2019-04-18 ENCOUNTER — Telehealth: Payer: Self-pay | Admitting: Family Medicine

## 2019-04-18 NOTE — Telephone Encounter (Signed)
Copied from [place] [id number]. Topic: Quick Communication - Rx Refill/Question >> Apr 18, 2019  3:34 PM Leward Quan A wrote: Medication: fluconazole (DIFLUCAN) 150 MG tablet  Has the patient contacted their pharmacy? Yes.   (Agent: If no, request that the patient contact the pharmacy for the refill.) (Agent: If yes, when and what did the pharmacy advise?)  Preferred Pharmacy (with phone number or street name): [id number] [address], [place]. [phone] (Phone) [phone] (Fax)    Agent: Please be advised that RX refills may take up to 3 business days. We ask that you follow-up with your pharmacy.

## 2019-04-19 NOTE — Telephone Encounter (Signed)
Dr. Sarajane Jews please advise on refill of diflucan.  thanks

## 2019-04-22 MED ORDER — FLUCONAZOLE 150 MG PO TABS: 150.0000 mg | ORAL_TABLET | Freq: Once | ORAL | 5 refills | Status: AC

## 2019-04-22 NOTE — Telephone Encounter (Signed)
Call in #1 with 5 rf

## 2019-06-05 ENCOUNTER — Ambulatory Visit
Admission: RE | Admit: 2019-06-05 | Discharge: 2019-06-05 | Disposition: A | Discharge status: Home / Self Care | Payer: PRIVATE HEALTH INSURANCE | Source: Ambulatory Visit | Attending: Obstetrics and Gynecology | Admitting: Obstetrics and Gynecology

## 2019-06-05 DIAGNOSIS — Z1231 Encounter for screening mammogram for malignant neoplasm of breast: Secondary | ICD-10-CM

## 2019-06-21 ENCOUNTER — Other Ambulatory Visit: Payer: Self-pay | Admitting: Family Medicine

## 2019-09-23 ENCOUNTER — Ambulatory Visit (INDEPENDENT_AMBULATORY_CARE_PROVIDER_SITE_OTHER): Payer: PRIVATE HEALTH INSURANCE

## 2019-09-23 ENCOUNTER — Other Ambulatory Visit: Payer: Self-pay

## 2019-09-23 DIAGNOSIS — Z23 Encounter for immunization: Secondary | ICD-10-CM | POA: Diagnosis not present

## 2019-11-05 ENCOUNTER — Ambulatory Visit (INDEPENDENT_AMBULATORY_CARE_PROVIDER_SITE_OTHER): Payer: PRIVATE HEALTH INSURANCE | Admitting: Family Medicine

## 2019-11-05 ENCOUNTER — Other Ambulatory Visit: Payer: Self-pay

## 2019-11-05 ENCOUNTER — Encounter: Payer: Self-pay | Admitting: Family Medicine

## 2019-11-05 VITALS — BP 122/98 | HR 88 | Temp 98.2°F | Ht 68.0 in | Wt 195.0 lb

## 2019-11-05 DIAGNOSIS — R300 Dysuria: Secondary | ICD-10-CM | POA: Diagnosis not present

## 2019-11-05 DIAGNOSIS — N390 Urinary tract infection, site not specified: Secondary | ICD-10-CM

## 2019-11-05 LAB — POCT URINALYSIS DIPSTICK
Bilirubin, UA: NEGATIVE
Blood, UA: POSITIVE
Glucose, UA: NEGATIVE
Ketones, UA: NEGATIVE
Nitrite, UA: NEGATIVE
Protein, UA: NEGATIVE
Spec Grav, UA: 1.015 (ref 1.010–1.025)
Urobilinogen, UA: 0.2 E.U./dL
pH, UA: 6 (ref 5.0–8.0)

## 2019-11-05 MED ORDER — CIPROFLOXACIN HCL 500 MG PO TABS: 500.0000 mg | ORAL_TABLET | Freq: Two times a day (BID) | ORAL | 0 refills | Status: DC

## 2019-11-05 NOTE — Patient Instructions (Signed)
Health Maintenance Due  Topic Date Due  . Hepatitis C Screening  1966/09/16  . HIV Screening  08/12/1979  . TETANUS/TDAP  08/12/1983  . PAP SMEAR-Modifier  12/13/2019    No flowsheet data found.

## 2019-11-05 NOTE — Progress Notes (Signed)
   Subjective:    Patient ID: Morgan Doyle, female    DOB: [birth year], 53 y.o.   MRN: [id number]  HPI Here for 4 days of urinary urgency and burning. She has been nauseated but has not vomited. No fever. Some low back pain. She has never had a UTI before. She is drinking water and cranberry juice.   Review of Systems  Constitutional: Negative.   Respiratory: Negative.   Cardiovascular: Negative.   Gastrointestinal: Negative.   Genitourinary: Positive for dysuria, frequency and urgency. Negative for flank pain and pelvic pain.       Objective:   Physical Exam Constitutional:      Appearance: Normal appearance. She is not ill-appearing.  Cardiovascular:     Rate and Rhythm: Normal rate and regular rhythm.     Pulses: Normal pulses.     Heart sounds: Normal heart sounds.  Pulmonary:     Effort: Pulmonary effort is normal.     Breath sounds: Normal breath sounds.  Abdominal:     General: Abdomen is flat. Bowel sounds are normal. There is no distension.     Palpations: Abdomen is soft. There is no mass.     Tenderness: There is no abdominal tenderness. There is no right CVA tenderness, left CVA tenderness, guarding or rebound.     Hernia: No hernia is present.  Neurological:     Mental Status: She is alert.           Assessment & Plan:  UTI, treat with Cipro. Culture the sample.  Alysia Penna, MD

## 2019-11-07 LAB — URINE CULTURE
MICRO NUMBER:: 1134687
SPECIMEN QUALITY:: ADEQUATE

## 2019-11-18 ENCOUNTER — Ambulatory Visit (INDEPENDENT_AMBULATORY_CARE_PROVIDER_SITE_OTHER): Payer: PRIVATE HEALTH INSURANCE | Admitting: Family Medicine

## 2019-11-18 ENCOUNTER — Other Ambulatory Visit: Payer: Self-pay

## 2019-11-18 ENCOUNTER — Encounter: Payer: Self-pay | Admitting: Family Medicine

## 2019-11-18 VITALS — BP 132/100 | HR 67 | Temp 97.8°F | Ht 67.0 in | Wt 192.0 lb

## 2019-11-18 DIAGNOSIS — Z0000 Encounter for general adult medical examination without abnormal findings: Secondary | ICD-10-CM

## 2019-11-18 DIAGNOSIS — E559 Vitamin D deficiency, unspecified: Secondary | ICD-10-CM

## 2019-11-18 DIAGNOSIS — Z23 Encounter for immunization: Secondary | ICD-10-CM

## 2019-11-18 LAB — LIPID PANEL
Cholesterol: 244 mg/dL — ABNORMAL HIGH (ref 0–200)
HDL: 61.6 mg/dL (ref >39.00)
LDL Cholesterol: 172 mg/dL — ABNORMAL HIGH (ref 0–99)
NonHDL: 182.51
Total CHOL/HDL Ratio: 4
Triglycerides: 53 mg/dL (ref 0.0–149.0)
VLDL: 10.6 mg/dL (ref 0.0–40.0)

## 2019-11-18 LAB — CBC WITH DIFFERENTIAL/PLATELET
Basophils Absolute: 0 10*3/uL (ref 0.0–0.1)
Basophils Relative: 0.3 % (ref 0.0–3.0)
Eosinophils Absolute: 0 10*3/uL (ref 0.0–0.7)
Eosinophils Relative: 1 % (ref 0.0–5.0)
HCT: 44 % (ref 36.0–46.0)
Hemoglobin: 14 g/dL (ref 12.0–15.0)
Lymphocytes Relative: 52.1 % — ABNORMAL HIGH (ref 12.0–46.0)
Lymphs Abs: 2.3 10*3/uL (ref 0.7–4.0)
MCHC: 31.8 g/dL (ref 30.0–36.0)
MCV: 79.7 fl (ref 78.0–100.0)
Monocytes Absolute: 0.3 10*3/uL (ref 0.1–1.0)
Monocytes Relative: 7.5 % (ref 3.0–12.0)
Neutro Abs: 1.7 10*3/uL (ref 1.4–7.7)
Neutrophils Relative %: 39.1 % — ABNORMAL LOW (ref 43.0–77.0)
Platelets: 282 10*3/uL (ref 150.0–400.0)
RBC: 5.53 Mil/uL — ABNORMAL HIGH (ref 3.87–5.11)
RDW: 15.5 % (ref 11.5–15.5)
WBC: 4.3 10*3/uL (ref 4.0–10.5)

## 2019-11-18 LAB — BASIC METABOLIC PANEL
BUN: 18 mg/dL (ref 6–23)
CO2: 33 mEq/L — ABNORMAL HIGH (ref 19–32)
Calcium: 10.2 mg/dL (ref 8.4–10.5)
Chloride: 102 mEq/L (ref 96–112)
Creatinine, Ser: 0.96 mg/dL (ref 0.40–1.20)
GFR: 72.94 mL/min (ref >60.00)
Glucose, Bld: 91 mg/dL (ref 70–99)
Potassium: 3.6 mEq/L (ref 3.5–5.1)
Sodium: 142 mEq/L (ref 135–145)

## 2019-11-18 LAB — VITAMIN D 25 HYDROXY (VIT D DEFICIENCY, FRACTURES): VITD: 25.42 ng/mL — ABNORMAL LOW (ref 30.00–100.00)

## 2019-11-18 LAB — HEPATIC FUNCTION PANEL
ALT: 16 U/L (ref 0–35)
AST: 22 U/L (ref 0–37)
Albumin: 4.4 g/dL (ref 3.5–5.2)
Alkaline Phosphatase: 65 U/L (ref 39–117)
Bilirubin, Direct: 0.1 mg/dL (ref 0.0–0.3)
Total Bilirubin: 0.6 mg/dL (ref 0.2–1.2)
Total Protein: 7.5 g/dL (ref 6.0–8.3)

## 2019-11-18 LAB — URINALYSIS
Bilirubin Urine: NEGATIVE
Hgb urine dipstick: NEGATIVE
Ketones, ur: NEGATIVE
Leukocytes,Ua: NEGATIVE
Nitrite: NEGATIVE
Specific Gravity, Urine: 1.02 (ref 1.000–1.030)
Total Protein, Urine: NEGATIVE
Urine Glucose: NEGATIVE
Urobilinogen, UA: 0.2 (ref 0.0–1.0)
pH: 7 (ref 5.0–8.0)

## 2019-11-18 LAB — TSH: TSH: 0.9 u[IU]/mL (ref 0.35–4.50)

## 2019-11-18 MED ORDER — VALACYCLOVIR HCL 500 MG PO TABS: ORAL_TABLET | ORAL | 3 refills | Status: DC

## 2019-11-18 MED ORDER — TRIAMTERENE-HCTZ 37.5-25 MG PO CAPS: ORAL_CAPSULE | ORAL | 3 refills | Status: DC

## 2019-11-18 MED ORDER — AMLODIPINE BESYLATE 5 MG PO TABS: 5.0000 mg | ORAL_TABLET | Freq: Every day | ORAL | 3 refills | Status: DC

## 2019-11-18 NOTE — Progress Notes (Signed)
   Subjective:    Patient ID: Morgan Doyle, female    DOB: August 07, 1966, 53 y.o.   MRN: [id number]  HPI Here for a well exam. She feels well. She was recently treated for a UTI and she feels better.     Review of Systems  Constitutional: Negative.   HENT: Negative.   Eyes: Negative.   Respiratory: Negative.   Cardiovascular: Negative.   Gastrointestinal: Negative.   Genitourinary: Negative for decreased urine volume, difficulty urinating, dyspareunia, dysuria, enuresis, flank pain, frequency, hematuria, pelvic pain and urgency.  Musculoskeletal: Negative.   Skin: Negative.   Neurological: Negative.   Psychiatric/Behavioral: Negative.        Objective:   Physical Exam Constitutional:      General: She is not in acute distress.    Appearance: She is well-developed.  HENT:     Head: Normocephalic and atraumatic.     Right Ear: External ear normal.     Left Ear: External ear normal.     Nose: Nose normal.     Mouth/Throat:     Pharynx: No oropharyngeal exudate.  Eyes:     General: No scleral icterus.    Conjunctiva/sclera: Conjunctivae normal.     Pupils: Pupils are equal, round, and reactive to light.  Neck:     Musculoskeletal: Normal range of motion and neck supple.     Thyroid: No thyromegaly.     Vascular: No JVD.  Cardiovascular:     Rate and Rhythm: Normal rate and regular rhythm.     Heart sounds: Normal heart sounds. No murmur. No friction rub. No gallop.   Pulmonary:     Effort: Pulmonary effort is normal. No respiratory distress.     Breath sounds: Normal breath sounds. No wheezing or rales.  Chest:     Chest wall: No tenderness.  Abdominal:     General: Bowel sounds are normal. There is no distension.     Palpations: Abdomen is soft. There is no mass.     Tenderness: There is no abdominal tenderness. There is no guarding or rebound.  Musculoskeletal: Normal range of motion.        General: No tenderness.  Lymphadenopathy:     Cervical: No cervical  adenopathy.  Skin:    General: Skin is warm and dry.     Findings: No erythema or rash.  Neurological:     Mental Status: She is alert and oriented to person, place, and time.     Cranial Nerves: No cranial nerve deficit.     Motor: No abnormal muscle tone.     Coordination: Coordination normal.     Deep Tendon Reflexes: Reflexes are normal and symmetric. Reflexes normal.  Psychiatric:        Behavior: Behavior normal.        Thought Content: Thought content normal.        Judgment: Judgment normal.           Assessment & Plan:  Well exam. We discussed diet and exercise. Get fasting labs.  Alysia Penna, MD

## 2019-11-19 ENCOUNTER — Other Ambulatory Visit: Payer: Self-pay | Admitting: *Deleted

## 2019-11-19 MED ORDER — CHOLECALCIFEROL 1.25 MG (50000 UT) PO TABS: ORAL_TABLET | ORAL | 3 refills | Status: DC

## 2019-11-19 MED ORDER — ATORVASTATIN CALCIUM 20 MG PO TABS: 20.0000 mg | ORAL_TABLET | Freq: Every day | ORAL | 3 refills | Status: DC

## 2019-11-19 NOTE — Addendum Note (Signed)
Addended by: Gwenyth Ober R on: 11/19/2019 04:32 PM   Modules accepted: Orders

## 2019-11-20 ENCOUNTER — Telehealth: Payer: Self-pay | Admitting: Family Medicine

## 2019-11-20 NOTE — Telephone Encounter (Signed)
Copied from [place] [id number]. Topic: General - Other >> Nov 20, 2019  8:44 AM Keene Breath wrote: Reason for CRM: Patient called to ask the nurse to call her regarding some symptoms that she is having.  She stated she is achy, no fever, chills and just not feeling well.  She is not sure what she needs to do.  Please call with advice at [phone]

## 2019-11-20 NOTE — Telephone Encounter (Signed)
Spoke to patient and she stated that she has a temp of 101.  Patient offered virtual visit but declined and stated that she was on her way to the urgent care by her house, [facility] on [address] so that she could get a Covid and flu test.

## 2019-11-21 ENCOUNTER — Encounter: Payer: Self-pay | Admitting: Family Medicine

## 2019-11-21 NOTE — Telephone Encounter (Signed)
Spoke to pt and reminded her that she received a Tdap vaccine. Pt stated her sx started 11/18/2019 same day pt received vaccine. I advised pt to sign up on Mychart and send a picture in so that we can see her arm. Pt stated she has a fever as well. Pt notified of update. Pt advised to go to local UC or ED if sx persisted. PT verbalized understanding.

## 2019-11-21 NOTE — Telephone Encounter (Signed)
Pt stated her arm is still swollen and red. She was tested for the flu and given a rapid Covid test and PCR swab. The Flu test and rapid Covid test were negative. The PCR has not come back yet. She would like a callback today. Temp was 97.6 this morning. Please advise.

## 2019-11-22 ENCOUNTER — Encounter: Payer: Self-pay | Admitting: Family Medicine

## 2019-11-22 NOTE — Telephone Encounter (Signed)
Noted  

## 2019-11-22 NOTE — Telephone Encounter (Signed)
Spoke to pt to see how she was feeling and she stated she is doing a little better. Spoke to Dr.Fry and he stated that picture that was uploaded to [place] is an allergic reaction to the TDAP. Pt notified of allergy and advised to put ice pack on swollen arm. Pt chart will reflect allergy. Pt also advise to call us back if she needs anything. Pt requesting a note to cover 12/8/202-11/22/2019 and returning to work on 11/25/2019.

## 2019-11-22 NOTE — Telephone Encounter (Signed)
Yes please give her the work note

## 2019-11-24 ENCOUNTER — Encounter: Payer: Self-pay | Admitting: Family Medicine

## 2019-11-26 NOTE — Telephone Encounter (Signed)
Called pt to check on status no answer. Pt was advised via vm to only call back if sx are no better.

## 2020-02-13 ENCOUNTER — Ambulatory Visit: Payer: PRIVATE HEALTH INSURANCE | Attending: Internal Medicine

## 2020-02-13 DIAGNOSIS — Z20822 Contact with and (suspected) exposure to covid-19: Secondary | ICD-10-CM

## 2020-02-14 ENCOUNTER — Other Ambulatory Visit: Payer: PRIVATE HEALTH INSURANCE

## 2020-02-14 LAB — NOVEL CORONAVIRUS, NAA: SARS-CoV-2, NAA: NOT DETECTED

## 2020-02-15 ENCOUNTER — Encounter: Payer: Self-pay | Admitting: Family Medicine

## 2020-02-15 DIAGNOSIS — E785 Hyperlipidemia, unspecified: Secondary | ICD-10-CM

## 2020-02-17 NOTE — Telephone Encounter (Signed)
I put in lab orders so she can schedule this

## 2020-02-17 NOTE — Telephone Encounter (Signed)
I entered this in her Allergies section so it will be easy to see

## 2020-03-06 ENCOUNTER — Other Ambulatory Visit: Payer: PRIVATE HEALTH INSURANCE

## 2020-03-17 ENCOUNTER — Other Ambulatory Visit: Payer: PRIVATE HEALTH INSURANCE

## 2020-03-25 ENCOUNTER — Telehealth: Payer: Self-pay | Admitting: Family Medicine

## 2020-03-25 NOTE — Telephone Encounter (Signed)
Pt is scheduled to receive COVID vaccine this Friday 4/16 at 3:30pm . Pt states she and a bad allergic reaction to the Tdap vaccine and wants to be sure she will be ok to get the COVID vaccine?    Pt can be reached at [phone] and needs call before Friday

## 2020-03-26 NOTE — Telephone Encounter (Signed)
Yes I would encourage her to get the Covid vaccine. She may well have a reaction, like many people, but it is temporary. I suggest she drink lots of water and take Tylenol both before and after the shot

## 2020-03-27 ENCOUNTER — Encounter: Payer: Self-pay | Admitting: Family Medicine

## 2020-03-27 ENCOUNTER — Ambulatory Visit: Payer: PRIVATE HEALTH INSURANCE

## 2020-03-27 ENCOUNTER — Ambulatory Visit: Payer: PRIVATE HEALTH INSURANCE | Attending: Internal Medicine

## 2020-03-27 DIAGNOSIS — Z23 Encounter for immunization: Secondary | ICD-10-CM

## 2020-03-27 NOTE — Progress Notes (Signed)
   Covid-19 Vaccination Clinic  Name:  Morgan Doyle    MRN: [id number] DOB: [birth year]  03/27/2020  [name] was observed post Covid-19 immunization for 30 minutes based on pre-vaccination screening without incident. She was provided with Vaccine Information Sheet and instruction to access the V-Safe system.   [name] was instructed to call 911 with any severe reactions post vaccine: Marland Kitchen Difficulty breathing  . Swelling of face and throat  . A fast heartbeat  . A bad rash all over body  . Dizziness and weakness   Immunizations Administered    Name Date Dose VIS Date Route   Pfizer COVID-19 Vaccine 03/27/2020  3:29 PM 0.3 mL 11/22/2019 Intramuscular   Manufacturer: [place]   Lot: [id number]   [place]: [id number]

## 2020-04-02 ENCOUNTER — Other Ambulatory Visit: Payer: Self-pay

## 2020-04-03 ENCOUNTER — Other Ambulatory Visit (INDEPENDENT_AMBULATORY_CARE_PROVIDER_SITE_OTHER): Payer: PRIVATE HEALTH INSURANCE

## 2020-04-03 DIAGNOSIS — E785 Hyperlipidemia, unspecified: Secondary | ICD-10-CM

## 2020-04-03 LAB — LIPID PANEL
Cholesterol: 222 mg/dL — ABNORMAL HIGH (ref 0–200)
HDL: 47.9 mg/dL (ref >39.00)
LDL Cholesterol: 164 mg/dL — ABNORMAL HIGH (ref 0–99)
NonHDL: 174.12
Total CHOL/HDL Ratio: 5
Triglycerides: 52 mg/dL (ref 0.0–149.0)
VLDL: 10.4 mg/dL (ref 0.0–40.0)

## 2020-04-03 LAB — HEPATIC FUNCTION PANEL
ALT: 16 U/L (ref 0–35)
AST: 18 U/L (ref 0–37)
Albumin: 4.2 g/dL (ref 3.5–5.2)
Alkaline Phosphatase: 71 U/L (ref 39–117)
Bilirubin, Direct: 0.1 mg/dL (ref 0.0–0.3)
Total Bilirubin: 0.5 mg/dL (ref 0.2–1.2)
Total Protein: 7.1 g/dL (ref 6.0–8.3)

## 2020-04-07 ENCOUNTER — Telehealth: Payer: Self-pay | Admitting: Family Medicine

## 2020-04-07 NOTE — Telephone Encounter (Signed)
Patient was calling because she received a message yesterday of someone calling her.  She states she is at work so if someone calls her back and she doesn't get to the phone if you could please let her know who called.  She is going to try to answer her phone when she gets a call back.

## 2020-04-07 NOTE — Telephone Encounter (Signed)
I have not reached out to the patient and I do not see that anyone else has tried to reach her.  Left message for patient to call back.

## 2020-04-09 ENCOUNTER — Encounter: Payer: Self-pay | Admitting: Family Medicine

## 2020-04-09 NOTE — Telephone Encounter (Signed)
Pt notified of update and questions sent to Dr.Fy for advise.

## 2020-04-10 ENCOUNTER — Other Ambulatory Visit: Payer: Self-pay | Admitting: *Deleted

## 2020-04-10 DIAGNOSIS — E785 Hyperlipidemia, unspecified: Secondary | ICD-10-CM

## 2020-04-10 MED ORDER — EZETIMIBE 10 MG PO TABS: 10.0000 mg | ORAL_TABLET | Freq: Every day | ORAL | 3 refills | Status: DC

## 2020-04-22 ENCOUNTER — Ambulatory Visit: Payer: PRIVATE HEALTH INSURANCE | Attending: Internal Medicine

## 2020-04-22 DIAGNOSIS — Z23 Encounter for immunization: Secondary | ICD-10-CM

## 2020-04-22 NOTE — Progress Notes (Signed)
   Covid-19 Vaccination Clinic  Name:  Morgan Doyle    MRN: [id number] DOB: 1966/10/20  04/22/2020  [name] was observed post Covid-19 immunization for 30 minutes based on pre-vaccination screening without incident. She was provided with Vaccine Information Sheet and instruction to access the V-Safe system.   [name] was instructed to call 911 with any severe reactions post vaccine: Marland Kitchen Difficulty breathing  . Swelling of face and throat  . A fast heartbeat  . A bad rash all over body  . Dizziness and weakness   Immunizations Administered    Name Date Dose VIS Date Route   Pfizer COVID-19 Vaccine 04/22/2020  4:38 PM 0.3 mL 02/05/2019 Intramuscular   Manufacturer: [place]   Lot: [id number]   [place]: [id number]

## 2020-06-29 NOTE — Addendum Note (Signed)
Addended by: Marrion Coy on: 06/29/2020 09:04 AM   Modules accepted: Orders

## 2020-07-10 ENCOUNTER — Other Ambulatory Visit (INDEPENDENT_AMBULATORY_CARE_PROVIDER_SITE_OTHER): Payer: PRIVATE HEALTH INSURANCE

## 2020-07-10 ENCOUNTER — Other Ambulatory Visit: Payer: Self-pay

## 2020-07-10 DIAGNOSIS — E785 Hyperlipidemia, unspecified: Secondary | ICD-10-CM | POA: Diagnosis not present

## 2020-07-10 LAB — LIPID PANEL
Cholesterol: 184 mg/dL (ref <200)
HDL: 56 mg/dL (ref >=50)
LDL Cholesterol (Calc): 114 mg/dL (calc) — ABNORMAL HIGH
Non-HDL Cholesterol (Calc): 128 mg/dL (calc) (ref <130)
Total CHOL/HDL Ratio: 3.3 (calc) (ref <5.0)
Triglycerides: 48 mg/dL (ref <150)

## 2020-09-29 ENCOUNTER — Other Ambulatory Visit: Payer: Self-pay

## 2020-09-29 ENCOUNTER — Encounter: Payer: Self-pay | Admitting: Family Medicine

## 2020-09-29 ENCOUNTER — Ambulatory Visit (INDEPENDENT_AMBULATORY_CARE_PROVIDER_SITE_OTHER): Payer: PRIVATE HEALTH INSURANCE

## 2020-09-29 DIAGNOSIS — Z23 Encounter for immunization: Secondary | ICD-10-CM | POA: Diagnosis not present

## 2020-10-22 ENCOUNTER — Ambulatory Visit (INDEPENDENT_AMBULATORY_CARE_PROVIDER_SITE_OTHER): Payer: PRIVATE HEALTH INSURANCE | Admitting: Family Medicine

## 2020-10-22 ENCOUNTER — Encounter: Payer: Self-pay | Admitting: Family Medicine

## 2020-10-22 ENCOUNTER — Other Ambulatory Visit: Payer: Self-pay

## 2020-10-22 VITALS — BP 138/86 | HR 65 | Temp 95.0°F | Ht 67.0 in | Wt 193.2 lb

## 2020-10-22 DIAGNOSIS — G8929 Other chronic pain: Secondary | ICD-10-CM

## 2020-10-22 DIAGNOSIS — M25562 Pain in left knee: Secondary | ICD-10-CM | POA: Diagnosis not present

## 2020-10-22 DIAGNOSIS — M65332 Trigger finger, left middle finger: Secondary | ICD-10-CM | POA: Diagnosis not present

## 2020-10-22 MED ORDER — MELOXICAM 15 MG PO TABS
15.0000 mg | ORAL_TABLET | Freq: Every day | ORAL | 3 refills | Status: DC
Start: 2020-10-22 — End: 2021-12-16

## 2020-10-22 NOTE — Progress Notes (Signed)
   Subjective:    Patient ID: Morgan Doyle, female    DOB: 10/29/1966, 54 y.o.   MRN: [id number]  HPI Here for 3 months of pain in the left 3rd finger and the finger often gets stuck in a flexed position. She then has to pop it out which is painful. No hx of trauma. Also for 2 months she has had pain in the left knee. No swelling or redness or warmth. No hx of trauma. This mostly bothers her when she goes up or down steps. She has been taking Aleve every day with mixed results.    Review of Systems  Constitutional: Negative.   Respiratory: Negative.   Cardiovascular: Negative.   Musculoskeletal: Positive for arthralgias.       Objective:   Physical Exam Constitutional:      Appearance: Normal appearance.  Cardiovascular:     Rate and Rhythm: Normal rate and regular rhythm.     Pulses: Normal pulses.     Heart sounds: Normal heart sounds.  Pulmonary:     Effort: Pulmonary effort is normal.     Breath sounds: Normal breath sounds.  Musculoskeletal:     Comments: The left 3rd finger appears normal with no swelling. However full extension is very painful and the 3rd MCP joint is tender. Also the left knee appears normal but she is tender in both joint spaces. ROM is full but crepitus is present   Neurological:     Mental Status: She is alert.           Assessment & Plan:  She has osteoarthritis in the left knee and she has a trigger finger. She will begin Meloxicam 15 mg daily. She can wear an elastic support sleeve on the knee. We will refer her to Hand Surgery for the trigger finger.  Alysia Penna, MD

## 2020-10-25 ENCOUNTER — Encounter: Payer: Self-pay | Admitting: Family Medicine

## 2020-10-26 NOTE — Telephone Encounter (Signed)
Tell her this medication is quite safe for her to take. ALL medications have potential side effects but these are quite rare

## 2020-11-18 ENCOUNTER — Encounter: Payer: PRIVATE HEALTH INSURANCE | Admitting: Family Medicine

## 2020-11-30 ENCOUNTER — Encounter: Payer: Self-pay | Admitting: Family Medicine

## 2020-12-01 ENCOUNTER — Encounter: Payer: PRIVATE HEALTH INSURANCE | Admitting: Family Medicine

## 2020-12-01 NOTE — Telephone Encounter (Signed)
She should have a virtual OV to answer these questions

## 2020-12-02 ENCOUNTER — Encounter: Payer: Self-pay | Admitting: Family Medicine

## 2020-12-02 ENCOUNTER — Telehealth (INDEPENDENT_AMBULATORY_CARE_PROVIDER_SITE_OTHER): Payer: PRIVATE HEALTH INSURANCE | Admitting: Family Medicine

## 2020-12-02 VITALS — Ht 67.01 in | Wt 190.0 lb

## 2020-12-02 DIAGNOSIS — U071 COVID-19: Secondary | ICD-10-CM | POA: Diagnosis not present

## 2020-12-02 MED ORDER — ONDANSETRON HCL 8 MG PO TABS: 8.0000 mg | ORAL_TABLET | Freq: Three times a day (TID) | ORAL | 1 refills | Status: DC | PRN

## 2020-12-02 MED ORDER — AZITHROMYCIN 250 MG PO TABS: ORAL_TABLET | ORAL | 0 refills | Status: DC

## 2020-12-02 NOTE — Progress Notes (Signed)
Subjective:    Patient ID: Morgan Doyle, female    DOB: [birth year], 54 y.o.   MRN: [id number]  HPI Virtual Visit via Video Note  I connected with the patient on 12/02/20 at  1:45 PM EST by a video enabled telemedicine application and verified that I am speaking with the correct person using two identifiers.  Location patient: home Location provider:work or home office Persons participating in the virtual visit: patient, provider  I discussed the limitations of evaluation and management by telemedicine and the availability of in person appointments. The patient expressed understanding and agreed to proceed.   HPI: Here for a Covid-19 infection. For the past week she has had a dry cough, body aches, vomiting, and diarrhea. She went to an urgent care clinic on 11-26-20 and she tested positive for the Covid virus. They gave her Benzonatate to take for the cough. She has also been taking Tylenol and drinking fluids. She is still very nauseated and she cannot tolerate eating food. No SOB or chest pain. Her husband has had similar symptoms, but he recently tested negative for Covid. They had attended a large family gathering in [place] 2 weeks ago and multiple family members are now sick.    ROS: See pertinent positives and negatives per HPI.  Past Medical History:  Diagnosis Date   Allergy    seasonal   Anemia    History of anemia   Arthritis    no meds   Headache(784.0)    OTC excedrin    Hypertension     Past Surgical History:  Procedure Laterality Date   ABDOMINAL HYSTERECTOMY  08-10-11   TAH and left SO per Dr. Philis Pique    COLONOSCOPY  01/12/2016   per Dr. Silverio Decamp, benign polyps, repeat in 10 yrs    CRYOABLATION     IN oFFICE PER Pt - 1990's   CYSTOSCOPY  08/10/2011   Procedure: CYSTOSCOPY;  Surgeon: Daria Pastures;  Location: [facility];  Service: Gynecology;;   DILATION AND EVACUATION      2006  MAB   svd     1996    Family History  Problem  Relation Age of Onset   Colon polyps Mother    Clotting disorder Mother    Colon cancer Paternal Uncle    Stomach cancer Paternal Uncle    Breast cancer Paternal Aunt    Esophageal cancer Neg Hx    Rectal cancer Neg Hx      Current Outpatient Medications:    amLODipine (NORVASC) 5 MG tablet, Take 1 tablet (5 mg total) by mouth daily., Disp: 90 tablet, Rfl: 3   benzonatate (TESSALON) 200 MG capsule, Take by mouth., Disp: , Rfl:    Cholecalciferol 1.25 MG (50000 UT) TABS, 50,000 units PO qwk for 12 weeks., Disp: , Rfl:    ezetimibe (ZETIA) 10 MG tablet, Take 1 tablet (10 mg total) by mouth daily., Disp: 90 tablet, Rfl: 3   fexofenadine (ALLEGRA) 180 MG tablet, Take 180 mg by mouth once as needed., Disp: , Rfl:    ibuprofen (ADVIL,MOTRIN) 200 MG tablet, Take 200 mg by mouth as needed., Disp: , Rfl:    meloxicam (MOBIC) 15 MG tablet, Take 1 tablet (15 mg total) by mouth daily., Disp: 90 tablet, Rfl: 3   Multiple Vitamin (MULTIVITAMIN) tablet, Take 1 tablet by mouth daily., Disp: , Rfl:    triamterene-hydrochlorothiazide (DYAZIDE) 37.5-25 MG capsule, TAKE ONE CAPSULE BY MOUTH EVERY MORNING, Disp: 90 capsule, Rfl: 3  valACYclovir (VALTREX) 500 MG tablet, TAKE 1 TABLET(S) EVERY DAY BY ORAL ROUTE as needed, Disp: 90 tablet, Rfl: 3   azithromycin (ZITHROMAX Z-PAK) 250 MG tablet, As directed, Disp: 6 each, Rfl: 0   ondansetron (ZOFRAN) 8 MG tablet, Take 1 tablet (8 mg total) by mouth every 8 (eight) hours as needed for nausea or vomiting., Disp: 30 tablet, Rfl: 1  EXAM:  VITALS per patient if applicable:  GENERAL: alert, oriented, appears well and in no acute distress  HEENT: atraumatic, conjunttiva clear, no obvious abnormalities on inspection of external nose and ears  NECK: normal movements of the head and neck  LUNGS: on inspection no signs of respiratory distress, breathing rate appears normal, no obvious gross SOB, gasping or wheezing  CV: no obvious  cyanosis  MS: moves all visible extremities without noticeable abnormality  PSYCH/NEURO: pleasant and cooperative, no obvious depression or anxiety, speech and thought processing grossly intact  ASSESSMENT AND PLAN: Covid-19 infection. We will treat her with a Zpack, and she can try Zofran as needed for the nausea. She will quarantine at home for 10 days. Recheck prn.  Gershon Crane, MD  Discussed the following assessment and plan:  No diagnosis found.     I discussed the assessment and treatment plan with the patient. The patient was provided an opportunity to ask questions and all were answered. The patient agreed with the plan and demonstrated an understanding of the instructions.   The patient was advised to call back or seek an in-person evaluation if the symptoms worsen or if the condition fails to improve as anticipated.     Review of Systems     Objective:   Physical Exam        Assessment & Plan:

## 2021-01-13 ENCOUNTER — Other Ambulatory Visit: Payer: Self-pay | Admitting: Family Medicine

## 2021-01-25 ENCOUNTER — Other Ambulatory Visit: Payer: Self-pay | Admitting: Family Medicine

## 2021-01-26 ENCOUNTER — Other Ambulatory Visit: Payer: Self-pay

## 2021-01-26 MED ORDER — AMLODIPINE BESYLATE 5 MG PO TABS: 5.0000 mg | ORAL_TABLET | Freq: Every day | ORAL | 0 refills | Status: DC

## 2021-01-26 MED ORDER — VALACYCLOVIR HCL 500 MG PO TABS: ORAL_TABLET | ORAL | 0 refills | Status: DC

## 2021-03-03 ENCOUNTER — Telehealth: Payer: Self-pay | Admitting: Family Medicine

## 2021-03-03 NOTE — Telephone Encounter (Signed)
Spoke with pt explained that our office has not received any letter from her eye dr, Hulen Skains [facility] advised to fax a copy of pt report to our office

## 2021-03-03 NOTE — Telephone Encounter (Signed)
Patient states [facility] has sent a letter on 02/19/21 stating they were concerned about a retinal hemorrage and needs blood work done.  She hasn't heard anything and is kind of upset about it.  Please give patient a call.

## 2021-03-03 NOTE — Telephone Encounter (Signed)
We can discuss this at her OV on Friday

## 2021-03-04 ENCOUNTER — Other Ambulatory Visit: Payer: Self-pay

## 2021-03-05 ENCOUNTER — Encounter: Payer: Self-pay | Admitting: Family Medicine

## 2021-03-05 ENCOUNTER — Ambulatory Visit (INDEPENDENT_AMBULATORY_CARE_PROVIDER_SITE_OTHER): Payer: PRIVATE HEALTH INSURANCE | Admitting: Family Medicine

## 2021-03-05 ENCOUNTER — Ambulatory Visit: Payer: PRIVATE HEALTH INSURANCE | Admitting: Family Medicine

## 2021-03-05 VITALS — BP 132/80 | HR 73 | Temp 98.2°F | Wt 188.0 lb

## 2021-03-05 DIAGNOSIS — H3562 Retinal hemorrhage, left eye: Secondary | ICD-10-CM

## 2021-03-05 DIAGNOSIS — R791 Abnormal coagulation profile: Secondary | ICD-10-CM | POA: Diagnosis not present

## 2021-03-05 LAB — CBC WITH DIFFERENTIAL/PLATELET
Basophils Absolute: 0 10*3/uL (ref 0.0–0.1)
Basophils Relative: 0.4 % (ref 0.0–3.0)
Eosinophils Absolute: 0.1 10*3/uL (ref 0.0–0.7)
Eosinophils Relative: 1.1 % (ref 0.0–5.0)
HCT: 43.1 % (ref 36.0–46.0)
Hemoglobin: 14 g/dL (ref 12.0–15.0)
Lymphocytes Relative: 38.7 % (ref 12.0–46.0)
Lymphs Abs: 2 10*3/uL (ref 0.7–4.0)
MCHC: 32.5 g/dL (ref 30.0–36.0)
MCV: 78.8 fl (ref 78.0–100.0)
Monocytes Absolute: 0.5 10*3/uL (ref 0.1–1.0)
Monocytes Relative: 8.7 % (ref 3.0–12.0)
Neutro Abs: 2.7 10*3/uL (ref 1.4–7.7)
Neutrophils Relative %: 51.1 % (ref 43.0–77.0)
Platelets: 286 10*3/uL (ref 150.0–400.0)
RBC: 5.47 Mil/uL — ABNORMAL HIGH (ref 3.87–5.11)
RDW: 15.8 % — ABNORMAL HIGH (ref 11.5–15.5)
WBC: 5.2 10*3/uL (ref 4.0–10.5)

## 2021-03-05 LAB — T3, FREE: T3, Free: 3 pg/mL (ref 2.3–4.2)

## 2021-03-05 LAB — BASIC METABOLIC PANEL
BUN: 19 mg/dL (ref 6–23)
CO2: 33 mEq/L — ABNORMAL HIGH (ref 19–32)
Calcium: 10.2 mg/dL (ref 8.4–10.5)
Chloride: 101 mEq/L (ref 96–112)
Creatinine, Ser: 0.87 mg/dL (ref 0.40–1.20)
GFR: 74.4 mL/min (ref >60.00)
Glucose, Bld: 99 mg/dL (ref 70–99)
Potassium: 3.8 mEq/L (ref 3.5–5.1)
Sodium: 141 mEq/L (ref 135–145)

## 2021-03-05 LAB — LIPID PANEL
Cholesterol: 190 mg/dL (ref 0–200)
HDL: 55.9 mg/dL (ref >39.00)
LDL Cholesterol: 123 mg/dL — ABNORMAL HIGH (ref 0–99)
NonHDL: 133.78
Total CHOL/HDL Ratio: 3
Triglycerides: 54 mg/dL (ref 0.0–149.0)
VLDL: 10.8 mg/dL (ref 0.0–40.0)

## 2021-03-05 LAB — T4, FREE: Free T4: 0.88 ng/dL (ref 0.60–1.60)

## 2021-03-05 LAB — TSH: TSH: 0.95 u[IU]/mL (ref 0.35–4.50)

## 2021-03-05 LAB — HEMOGLOBIN A1C: Hgb A1c MFr Bld: 6.6 % — ABNORMAL HIGH (ref 4.6–6.5)

## 2021-03-05 LAB — HEPATIC FUNCTION PANEL
ALT: 25 U/L (ref 0–35)
AST: 25 U/L (ref 0–37)
Albumin: 4.5 g/dL (ref 3.5–5.2)
Alkaline Phosphatase: 79 U/L (ref 39–117)
Bilirubin, Direct: 0.1 mg/dL (ref 0.0–0.3)
Total Bilirubin: 0.4 mg/dL (ref 0.2–1.2)
Total Protein: 7.7 g/dL (ref 6.0–8.3)

## 2021-03-05 LAB — APTT: aPTT: 38.9 s — ABNORMAL HIGH (ref 23.4–32.7)

## 2021-03-05 LAB — PROTIME-INR
INR: 1 ratio (ref 0.8–1.0)
Prothrombin Time: 11.5 s (ref 9.6–13.1)

## 2021-03-05 NOTE — Progress Notes (Signed)
   Subjective:    Patient ID: Morgan Doyle, female    DOB: 10/16/1966, 55 y.o.   MRN: [id number]  HPI Here to discuss a recently discovered left retinal hemorrhage. She saw Chales Salmon OD at [facility] on 02-19-21 for an eye exam. This was found and she was told to see [place]. Other than a mild general decrease in acuity in both eyes, Morgan Doyle has not noticed any change in vision in the left eye. No recent trauma. She did have a Covid-19 infection in December which caused a lot of hard coughing for several weeks. She has not noticed any abnormal bleeding or bruising.    Review of Systems  Constitutional: Negative.   Respiratory: Negative.   Cardiovascular: Negative.   Hematological: Negative.        Objective:   Physical Exam Constitutional:      Appearance: Normal appearance.  Cardiovascular:     Rate and Rhythm: Normal rate and regular rhythm.     Pulses: Normal pulses.     Heart sounds: Normal heart sounds.  Pulmonary:     Effort: Pulmonary effort is normal.     Breath sounds: Normal breath sounds.  Skin:    Findings: No bruising.  Neurological:     Mental Status: She is alert.           Assessment & Plan:  Retinal hemorrhage. I think the most likely etiology of this is the coughing she was doing last December. I advised her to stop taking Meloxicam and she can use Tylenol as needed for pain. We will get labs to day to look for other issues.  Alysia Penna, MD

## 2021-03-05 NOTE — Addendum Note (Signed)
Addended by: Tessie Fass D on: 03/05/2021 09:45 AM   Modules accepted: Orders

## 2021-03-06 NOTE — Addendum Note (Signed)
Addended by: Alysia Penna A on: 03/06/2021 11:06 AM   Modules accepted: Orders

## 2021-03-12 ENCOUNTER — Inpatient Hospital Stay: Payer: PRIVATE HEALTH INSURANCE | Attending: Hematology and Oncology | Admitting: Hematology and Oncology

## 2021-03-12 ENCOUNTER — Other Ambulatory Visit: Payer: Self-pay

## 2021-03-12 ENCOUNTER — Inpatient Hospital Stay: Payer: PRIVATE HEALTH INSURANCE

## 2021-03-12 ENCOUNTER — Encounter: Payer: Self-pay | Admitting: Hematology and Oncology

## 2021-03-12 VITALS — BP 114/89 | HR 85 | Temp 97.7°F | Resp 17 | Ht 67.0 in | Wt 186.2 lb

## 2021-03-12 DIAGNOSIS — R791 Abnormal coagulation profile: Secondary | ICD-10-CM | POA: Diagnosis present

## 2021-03-12 DIAGNOSIS — D649 Anemia, unspecified: Secondary | ICD-10-CM | POA: Diagnosis not present

## 2021-03-12 DIAGNOSIS — I10 Essential (primary) hypertension: Secondary | ICD-10-CM | POA: Diagnosis not present

## 2021-03-12 LAB — PROTIME-INR
INR: 1 (ref 0.8–1.2)
Prothrombin Time: 12.6 seconds (ref 11.4–15.2)

## 2021-03-12 LAB — APTT: aPTT: 33 seconds (ref 24–36)

## 2021-03-12 NOTE — Progress Notes (Signed)
[place] [phone]   [phone] [place] NOTE  Patient Care Team: Laurey Morale, MD as PCP - General  Hematological/Oncological History #Prolonged PTT 1) Labs from PCP, Dr. Alysia Penna, after patient presenting with retinal hemorrhage: PTT 38.9 sec, PT 11.5 sec, INR 1.0, WBC 5.2, Hgb 14.0, Plt 286 2) 03/12/2021: Establish care with Dr. Lorenso Courier  CHIEF COMPLAINTS/PURPOSE OF CONSULTATION:  "Prolonged PTT"  HISTORY OF PRESENTING ILLNESS:  Morgan Doyle 55 y.o. female with medical history significant for hypertension, anemia 2/2 fibroids s/p hysterectomy, headaches and arthritis. Patient is unaccompanied for this visit.   On review of the previous records, Morgan Doyle was found to have left retinal hemorrhage during eye exam by Chales Salmon, OD on 02/19/2021. Patient followed up with her PCP, Dr. Alysia Penna, who order blood work that revealed prolonged PTT of 38.9 seconds.    On exam today, Morgan Doyle reports ongoing fatigue that has been present for a couple of months. She continues to work full time and complete all her ADLs. However, she requires frequent resting. Her appetite is fair and she denies any weight changes. Patient denies any nausea, vomiting or abdominal pain. Patient reports daily bowel movements although evacuation is incomplete. She reports occasional episodes of BRBPR on tissue after wiping. She denies any rectal pain. Patient has noticed occasional bruising on her upper extremities. She denies any signs of frequent bleeding. She had one episode of epistaxis last week while she was cleaning her nose. She denies any fevers, chills, shortness of breath, chest pain or cough. She has no other complaints. Rest of the 10 point ROS is below.   MEDICAL HISTORY:  Past Medical History:  Diagnosis Date  . Allergy    seasonal  . Anemia    History of anemia  . Arthritis    no meds  . Headache(784.0)    OTC excedrin   .  Hypertension     SURGICAL HISTORY: Past Surgical History:  Procedure Laterality Date  . ABDOMINAL HYSTERECTOMY  08-10-11   TAH and left SO per Dr. Philis Pique   . COLONOSCOPY  01/12/2016   per Dr. Silverio Decamp, benign polyps, repeat in 10 yrs   . CRYOABLATION     IN oFFICE PER Pt - 1990's  . CYSTOSCOPY  08/10/2011   Procedure: CYSTOSCOPY;  Surgeon: Daria Pastures;  Location: [facility];  Service: Gynecology;;  . DILATION AND EVACUATION      2006  MAB  . svd     1996    SOCIAL HISTORY: Social History   Socioeconomic History  . Marital status: Married    Spouse name: Not on file  . Number of children: Not on file  . Years of education: Not on file  . Highest education level: Not on file  Occupational History  . Not on file  Tobacco Use  . Smoking status: Never Smoker  . Smokeless tobacco: Never Used  Substance and Sexual Activity  . Alcohol use: No    Alcohol/week: 0.0 standard drinks  . Drug use: No  . Sexual activity: Yes    Birth control/protection: Pill  Other Topics Concern  . Not on file  Social History Narrative  . Not on file   Social Determinants of Health   Financial Resource Strain: Not on file  Food Insecurity: Not on file  Transportation Needs: Not on file  Physical Activity: Not on file  Stress: Not on file  Social Connections: Not on file  Intimate  Partner Violence: Not on file    FAMILY HISTORY: Family History  Problem Relation Age of Onset  . Colon polyps Mother   . Clotting disorder Mother   . Colon cancer Paternal Uncle   . Stomach cancer Paternal Uncle   . Breast cancer Paternal Aunt   . Esophageal cancer Neg Hx   . Rectal cancer Neg Hx     ALLERGIES:  is allergic to codeine, other, pollen extract, tetanus toxoids, tree extract, and latex.  MEDICATIONS:  Current Outpatient Medications  Medication Sig Dispense Refill  . amLODipine (NORVASC) 5 MG tablet Take 1 tablet (5 mg total) by mouth daily. 90 tablet 0  . ezetimibe (ZETIA) 10 MG  tablet Take 1 tablet (10 mg total) by mouth daily. 90 tablet 3  . fexofenadine (ALLEGRA) 180 MG tablet Take 180 mg by mouth once as needed.    Marland Kitchen ibuprofen (ADVIL,MOTRIN) 200 MG tablet Take 200 mg by mouth as needed.    . meloxicam (MOBIC) 15 MG tablet Take 1 tablet (15 mg total) by mouth daily. (Patient not taking: Reported on 03/12/2021) 90 tablet 3  . Multiple Vitamin (MULTIVITAMIN) tablet Take 1 tablet by mouth daily.    Marland Kitchen triamterene-hydrochlorothiazide (DYAZIDE) 37.5-25 MG capsule TAKE 1 CAPSULE BY MOUTH EVERY DAY IN THE MORNING 90 capsule 1  . valACYclovir (VALTREX) 500 MG tablet TAKE 1 TABLET(S) EVERY DAY BY ORAL ROUTE as needed 90 tablet 0   No current facility-administered medications for this visit.    REVIEW OF SYSTEMS:   Constitutional: ( - ) fevers, ( - )  chills , ( - ) night sweats Eyes: ( - ) blurriness of vision, ( - ) double vision, ( - ) watery eyes Ears, nose, mouth, throat, and face: ( - ) mucositis, ( - ) sore throat Respiratory: ( - ) cough, ( - ) dyspnea, ( - ) wheezes Cardiovascular: ( - ) palpitation, ( - ) chest discomfort, ( - ) lower extremity swelling Gastrointestinal:  ( - ) nausea, ( - ) heartburn, ( + ) change in bowel habits Skin: ( - ) abnormal skin rashes Lymphatics: ( - ) new lymphadenopathy, ( - ) easy bruising Neurological: ( - ) numbness, ( - ) tingling, ( - ) new weaknesses Behavioral/Psych: ( - ) mood change, ( - ) new changes  All other systems were reviewed with the patient and are negative.  PHYSICAL EXAMINATION: ECOG PERFORMANCE STATUS: 0 - Asymptomatic  Vitals:   03/12/21 1316  BP: 114/89  Pulse: 85  Resp: 17  Temp: 97.7 F (36.5 C)  SpO2: 97%   Filed Weights   03/12/21 1316  Weight: 186 lb 3.2 oz (84.5 kg)    GENERAL: well appearing female in NAD  SKIN: skin color, texture, turgor are normal, no rashes or significant lesions EYES: conjunctiva are pink and non-injected, sclera clear OROPHARYNX: no exudate, no erythema; lips,  buccal mucosa, and tongue normal  NECK: supple, non-tender LYMPH:  no palpable lymphadenopathy in the cervical, axillary or supraclavicular lymph nodes.  LUNGS: clear to auscultation and percussion with normal breathing effort HEART: regular rate & rhythm and no murmurs and no lower extremity edema ABDOMEN: soft, non-tender, non-distended, normal bowel sounds Musculoskeletal: no cyanosis of digits and no clubbing  PSYCH: alert & oriented x 3, fluent speech NEURO: no focal motor/sensory deficits  LABORATORY DATA:  I have reviewed the data as listed CBC Latest Ref Rng & Units 03/05/2021 11/18/2019 11/16/2018  WBC 4.0 - 10.5 K/uL 5.2 4.3  6.9  Hemoglobin 12.0 - 15.0 g/dL 14.0 14.0 14.2  Hematocrit 36.0 - 46.0 % 43.1 44.0 43.9  Platelets 150.0 - 400.0 K/uL 286.0 282.0 294.0    CMP Latest Ref Rng & Units 03/05/2021 04/03/2020 11/18/2019  Glucose 70 - 99 mg/dL 99 - 91  BUN 6 - 23 mg/dL 19 - 18  Creatinine 0.40 - 1.20 mg/dL 0.87 - 0.96  Sodium 135 - 145 mEq/L 141 - 142  Potassium 3.5 - 5.1 mEq/L 3.8 - 3.6  Chloride 96 - 112 mEq/L 101 - 102  CO2 19 - 32 mEq/L 33(H) - 33(H)  Calcium 8.4 - 10.5 mg/dL 10.2 - 10.2  Total Protein 6.0 - 8.3 g/dL 7.7 7.1 7.5  Total Bilirubin 0.2 - 1.2 mg/dL 0.4 0.5 0.6  Alkaline Phos 39 - 117 U/L 79 71 65  AST 0 - 37 U/L 25 18 22   ALT 0 - 35 U/L 25 16 16    ASSESSMENT & PLAN ASHLAND OSMER is a 55 y.o. female presenting to the clinic for evaluation for prolonged PTT. Discussed further workup is needed with repeat PT/INR, PTT, PTT mixing study and lupus anticoagulation panel. Based on the initial workup, we will determine if further lab work is needed including evaluating for factor deficiencies versus inhibitors.   #Prolonged PTT: -Order labs to check PT/INR, PTT, PTT mixing study and lupus anticoagulation panel. -If workup is unremarkable, recommend to follow up with PCP to repeat levels in 3-6 months.   Orders Placed This Encounter  Procedures  . Protime-INR     Standing Status:   Future    Number of Occurrences:   1    Standing Expiration Date:   03/12/2022  . APTT    Standing Status:   Future    Number of Occurrences:   1    Standing Expiration Date:   03/12/2022  . PTT factor inhibitor (mixing study)    Standing Status:   Future    Number of Occurrences:   1    Standing Expiration Date:   03/12/2022  . Lupus anticoagulant panel*    Standing Status:   Future    Number of Occurrences:   1    Standing Expiration Date:   03/12/2022    All questions were answered. The patient knows to call the clinic with any problems, questions or concerns.  A total of more than 60 minutes were spent on this encounter and over half of that time was spent on counseling and coordination of care as outlined above.    Dede Query, PA-C Department of Hematology/Oncology [place] at [facility] Phone: [phone]  This patient was seen with Dr. Lorenso Courier.   I have read the above note and personally examined the patient. I agree with the assessment and plan as noted above.  Briefly Morgan Doyle is a 55 year old African-American female with medical history significant for an elevated PTT.  Given these findings we will repeat the PTT in order to confirm it is generally elevated and also perform a repeat PT/INR.  We will also perform a mixing study.  In the event the patient is found to have a genuine elevation in the PTT we could pursue this work-up further with individual factor levels.  There is no evidence of liver disease or medication driving up this value.  It is possible this 1 single spurious result.  Our follow-up and further recommendations will be based on her labs today.   Ledell Peoples, MD Department  of Hematology/Oncology [place] at [facility] Phone: [phone] Pager: [phone] Email: [email or web address]

## 2021-03-13 LAB — LUPUS ANTICOAGULANT PANEL
DRVVT: 41.2 s (ref 0.0–47.0)
PTT Lupus Anticoagulant: 37.6 s (ref 0.0–51.9)

## 2021-03-15 LAB — PTT FACTOR INHIBITOR (MIXING STUDY): aPTT: 28.1 s (ref 22.9–30.2)

## 2021-03-16 ENCOUNTER — Telehealth: Payer: Self-pay | Admitting: Physician Assistant

## 2021-03-16 NOTE — Telephone Encounter (Signed)
  I spoke to [name] to review the labs that were obtained after our consultation on 03/12/2021. The labs were unremarkable including repeat PTT, PT/INR and lupus anticoagulant panel. No further workup is recommended. We advise repeat labs in 3-6 months by PCP. She inquired about her hemoglobin A1C results. I suggested she follow up with ordering provider, Dr. Alysia Penna, for recommendations.    Patient expressed understanding and satisfaction with the plan provided.

## 2021-04-20 ENCOUNTER — Other Ambulatory Visit: Payer: Self-pay | Admitting: Family Medicine

## 2021-04-20 DIAGNOSIS — E785 Hyperlipidemia, unspecified: Secondary | ICD-10-CM

## 2021-05-02 ENCOUNTER — Other Ambulatory Visit: Payer: Self-pay | Admitting: Family Medicine

## 2021-05-14 ENCOUNTER — Other Ambulatory Visit: Payer: Self-pay | Admitting: Family Medicine

## 2021-05-24 ENCOUNTER — Encounter: Payer: Self-pay | Admitting: Family Medicine

## 2021-05-24 NOTE — Telephone Encounter (Signed)
Spoke with pt state that she was at her eye dr today and they needed some lab results done at the hospital, pt state that the eye dr office called our office with the information that is needed. Pt is aware that we need pt consent to release any medical documents. Pt state that she will call them in the morning and get the information needed.

## 2021-05-25 NOTE — Telephone Encounter (Signed)
Spoke with Morgan Doyle with [facility], stated that they need pt last office visit and any labs done in the office faxed to their office. Pt document was faxed to Angelica their office to confirm that fax was received which the front agent agreed that it was on Amanda's desk. Called pt notified that medical records have been received by [facility].

## 2021-06-16 ENCOUNTER — Other Ambulatory Visit: Payer: Self-pay | Admitting: Family Medicine

## 2021-08-11 ENCOUNTER — Other Ambulatory Visit: Payer: Self-pay | Admitting: Family Medicine

## 2021-08-11 DIAGNOSIS — E785 Hyperlipidemia, unspecified: Secondary | ICD-10-CM

## 2021-08-17 ENCOUNTER — Other Ambulatory Visit: Payer: Self-pay | Admitting: Family Medicine

## 2021-11-14 ENCOUNTER — Other Ambulatory Visit: Payer: Self-pay | Admitting: Family Medicine

## 2021-11-24 ENCOUNTER — Ambulatory Visit (INDEPENDENT_AMBULATORY_CARE_PROVIDER_SITE_OTHER): Payer: PRIVATE HEALTH INSURANCE | Admitting: Family Medicine

## 2021-11-24 ENCOUNTER — Telehealth: Payer: Self-pay | Admitting: Family Medicine

## 2021-11-24 VITALS — BP 130/70 | HR 75 | Temp 97.9°F | Wt 186.9 lb

## 2021-11-24 DIAGNOSIS — J069 Acute upper respiratory infection, unspecified: Secondary | ICD-10-CM

## 2021-11-24 DIAGNOSIS — R062 Wheezing: Secondary | ICD-10-CM | POA: Diagnosis not present

## 2021-11-24 MED ORDER — ALBUTEROL SULFATE HFA 108 (90 BASE) MCG/ACT IN AERS
2.0000 | INHALATION_SPRAY | RESPIRATORY_TRACT | 0 refills | Status: DC | PRN
Start: 2021-11-24 — End: 2021-12-16

## 2021-11-24 MED ORDER — PREDNISONE 20 MG PO TABS: ORAL_TABLET | ORAL | 0 refills | Status: DC

## 2021-11-24 NOTE — Telephone Encounter (Signed)
Patient calling in with respiratory symptoms: Shortness of breath, chest pain, palpitations or other red words send to Triage  Does the patient have a fever over 100, cough, congestion, sore throat, runny nose, lost of taste/smell (please list symptoms that patient has)? Cough x 7 day and nasal congestion x 7 days  What date did symptoms start?11-17-2021 (If over 5 days ago, pt may be scheduled for in person visit)  Have you tested for Covid in the last 5 days? No   If yes, was it positive []  OR negative [] ? If positive in the last 5 days, please schedule virtual visit now. If negative, schedule for an in person OV with the next available provider if PCP has no openings. Please also let patient know they will be tested again (follow the script below)  "you will have to arrive 26mins prior to your appt time to be Covid tested. Please park in back of office at the cone & call [phone] to let the staff know you have arrived. A staff member will meet you at your car to do a rapid covid test. Once the test has resulted you will be notified by phone of your results to determine if appt will remain an in person visit or be converted to a virtual/phone visit. If you arrive less than 99mins before your appt time, your visit will be automatically converted to virtual & any recommended testing will happen AFTER the visit." Pt has an appt with dr Elease Hashimoto 11-24-2021  [place]  If no availability for virtual visit in office,  please schedule another [place] office  If no availability at another [place] office, please instruct patient that they can schedule an evisit or virtual visit through their mychart account. Visits up to 8pm  patients can be seen in office 5 days after positive COVID test

## 2021-11-24 NOTE — Progress Notes (Signed)
Established Patient Office Visit  Subjective:  Patient ID: Morgan Doyle, female    DOB: 1966-04-30  Age: 55 y.o. MRN: [id number]  CC:  Chief Complaint  Patient presents with   Cough    Cough, congestion, sore throat, x 1 week, covid negative last week    HPI  Morgan Doyle presents for upper respiratory symptoms.  Onset about a week ago.  She developed some sore throat and nasal congestion followed by cough.  Cough is most bothersome at night and his most bothersome symptom at this time.  Still has some mild sore throat.  No fever.  Home COVID test negative.  She has intolerance with codeine.  She is tried over-the-counter Delsym without much improvement.  Also tried some Tessalon.  She feels that she has some intermittent wheezing.  No history of asthma.  No nausea or vomiting.  Past Medical History:  Diagnosis Date   Allergy    seasonal   Anemia    History of anemia   Arthritis    no meds   Headache(784.0)    OTC excedrin    Hypertension     Past Surgical History:  Procedure Laterality Date   ABDOMINAL HYSTERECTOMY  08-10-11   TAH and left SO per Dr. Philis Pique    COLONOSCOPY  01/12/2016   per Dr. Silverio Decamp, benign polyps, repeat in 10 yrs    CRYOABLATION     IN oFFICE PER Pt - 1990's   CYSTOSCOPY  08/10/2011   Procedure: CYSTOSCOPY;  Surgeon: Daria Pastures;  Location: [facility];  Service: Gynecology;;   DILATION AND EVACUATION      2006  MAB   svd     1996    Family History  Problem Relation Age of Onset   Colon polyps Mother    Clotting disorder Mother    Colon cancer Paternal Uncle    Stomach cancer Paternal Uncle    Breast cancer Paternal Aunt    Esophageal cancer Neg Hx    Rectal cancer Neg Hx     Social History   Socioeconomic History   Marital status: Married    Spouse name: Not on file   Number of children: Not on file   Years of education: Not on file   Highest education level: Not on file  Occupational History   Not on file  Tobacco Use    Smoking status: Never   Smokeless tobacco: Never  Substance and Sexual Activity   Alcohol use: No    Alcohol/week: 0.0 standard drinks   Drug use: No   Sexual activity: Yes    Birth control/protection: Pill  Other Topics Concern   Not on file  Social History Narrative   Not on file   Social Determinants of Health   Financial Resource Strain: Not on file  Food Insecurity: Not on file  Transportation Needs: Not on file  Physical Activity: Not on file  Stress: Not on file  Social Connections: Not on file  Intimate Partner Violence: Not on file    Outpatient Medications Prior to Visit  Medication Sig Dispense Refill   amLODipine (NORVASC) 5 MG tablet TAKE 1 TABLET (5 MG TOTAL) BY MOUTH DAILY. 90 tablet 1   ezetimibe (ZETIA) 10 MG tablet TAKE 1 TABLET BY MOUTH EVERY DAY **MUST USE MAIL ORDER FOR MAITENANCE DRUGS** 90 tablet 1   fexofenadine (ALLEGRA) 180 MG tablet Take 180 mg by mouth once as needed.     ibuprofen (ADVIL,MOTRIN) 200 MG tablet  Take 200 mg by mouth as needed.     meloxicam (MOBIC) 15 MG tablet Take 1 tablet (15 mg total) by mouth daily. 90 tablet 3   Multiple Vitamin (MULTIVITAMIN) tablet Take 1 tablet by mouth daily.     triamterene-hydrochlorothiazide (DYAZIDE) 37.5-25 MG capsule TAKE 1 CAPSULE BY MOUTH EVERY DAY IN THE MORNING (MAIL ORDER) 90 capsule 0   valACYclovir (VALTREX) 500 MG tablet TAKE 1 TABLET BY MOUTH EVERY DAY AS NEEDED 90 tablet 1   No facility-administered medications prior to visit.    Allergies  Allergen Reactions   Codeine Nausea And Vomiting    Hallucination-heart racing nausea. Dizziness, stomach pains    Other Hives    NUTS,Pt. STATED SWELLING   Pollen Extract Itching    Itching, sneezing, hives, watery eyes,cough, sneezing   Tetanus Toxoids     Local redness and fever    Tree Extract     Nuts-throat swells, mouth itching, rash    Latex Rash    Bumps and itching last time at hospital.    ROS Review of Systems   Constitutional:  Negative for chills and fever.  HENT:  Positive for congestion and sore throat.   Respiratory:  Positive for cough and wheezing.   Cardiovascular:  Negative for chest pain.     Objective:    Physical Exam Vitals reviewed.  HENT:     Right Ear: Tympanic membrane normal.     Left Ear: Tympanic membrane normal.     Mouth/Throat:     Mouth: Mucous membranes are moist.     Pharynx: No oropharyngeal exudate.     Comments: Minimal posterior pharynx erythema.  No visible exudate Cardiovascular:     Rate and Rhythm: Normal rate and regular rhythm.  Pulmonary:     Comments: She has some faint expiratory wheezes.  No rales.  No retractions.  Pulse oximetry 99%. Musculoskeletal:     Cervical back: Neck supple.  Lymphadenopathy:     Cervical: No cervical adenopathy.    BP 130/70 (BP Location: Left Arm, Patient Position: Sitting, Cuff Size: Normal)    Pulse 75    Temp 97.9 F (36.6 C) (Oral)    Wt 186 lb 14.4 oz (84.8 kg)    LMP 05/10/2011    SpO2 99%    BMI 29.27 kg/m  Wt Readings from Last 3 Encounters:  11/24/21 186 lb 14.4 oz (84.8 kg)  03/12/21 186 lb 3.2 oz (84.5 kg)  03/05/21 188 lb (85.3 kg)     Health Maintenance Due  Topic Date Due   HIV Screening  Never done   Hepatitis C Screening  Never done   Zoster Vaccines- Shingrix (1 of 2) Never done   PAP SMEAR-Modifier  12/13/2019   COVID-19 Vaccine (3 - Booster for Pfizer series) 06/17/2020   MAMMOGRAM  06/04/2021   INFLUENZA VACCINE  07/12/2021    There are no preventive care reminders to display for this patient.  Lab Results  Component Value Date   TSH 0.95 03/05/2021   Lab Results  Component Value Date   WBC 5.2 03/05/2021   HGB 14.0 03/05/2021   HCT 43.1 03/05/2021   MCV 78.8 03/05/2021   PLT 286.0 03/05/2021   Lab Results  Component Value Date   NA 141 03/05/2021   K 3.8 03/05/2021   CO2 33 (H) 03/05/2021   GLUCOSE 99 03/05/2021   BUN 19 03/05/2021   CREATININE 0.87 03/05/2021    BILITOT 0.4 03/05/2021   ALKPHOS 79 03/05/2021  AST 25 03/05/2021   ALT 25 03/05/2021   PROT 7.7 03/05/2021   ALBUMIN 4.5 03/05/2021   CALCIUM 10.2 03/05/2021   ANIONGAP 7 09/28/2018   GFR 74.40 03/05/2021   Lab Results  Component Value Date   CHOL 190 03/05/2021   Lab Results  Component Value Date   HDL 55.90 03/05/2021   Lab Results  Component Value Date   LDLCALC 123 (H) 03/05/2021   Lab Results  Component Value Date   TRIG 54.0 03/05/2021   Lab Results  Component Value Date   CHOLHDL 3 03/05/2021   Lab Results  Component Value Date   HGBA1C 6.6 (H) 03/05/2021      Assessment & Plan:   Probable viral URI with cough.  Home COVID test negative.  She does have evidence for mild reactive airway component.  -Try prednisone 20 mg 2 tablets daily for 5 days -Albuterol MDI 2 puffs every 4-6 hours as needed for cough and wheeze -Follow-up for any persistent or worsening symptoms  Meds ordered this encounter  Medications   predniSONE (DELTASONE) 20 MG tablet    Sig: Take two tablets once daily for 5 days.    Dispense:  10 tablet    Refill:  0   albuterol (VENTOLIN HFA) 108 (90 Base) MCG/ACT inhaler    Sig: Inhale 2 puffs into the lungs every 4 (four) hours as needed for wheezing or shortness of breath.    Dispense:  8 g    Refill:  0    Follow-up: No follow-ups on file.    Carolann Littler, MD

## 2021-11-30 ENCOUNTER — Telehealth: Payer: Self-pay | Admitting: Family Medicine

## 2021-11-30 NOTE — Telephone Encounter (Signed)
Patient has appointment  scheduled on 12/01/21

## 2021-11-30 NOTE — Telephone Encounter (Signed)
Patient calling in with respiratory symptoms: Shortness of breath, chest pain, palpitations or other red words send to Triage  Does the patient have a fever over 100, cough, congestion, sore throat, runny nose, lost of taste/smell (please list symptoms that patient has)?cough, congestion, yellow mucus from nose, coughing up yellow mucus  What date did symptoms start? December 12th (If over 5 days ago, pt may be scheduled for in person visit)  Have you tested for Covid in the last 5 days? No    "you will have to arrive 47mins prior to your appt time to be Covid tested. Please park in back of office at the cone & call [phone] to let the staff know you have arrived. A staff member will meet you at your car to do a rapid covid test. Once the test has resulted you will be notified by phone of your results to determine if appt will remain an in person visit or be converted to a virtual/phone visit. If you arrive less than 33mins before your appt time, your visit will be automatically converted to virtual & any recommended testing will happen AFTER the visit."  Patient had an appointment last week with Dr. Elease Hashimoto where he prescribed prednisone. She says that she has finished the medication and the coughing still persists. She has now developed yellow mucus that is coming from her nose and that she is coughing up. Asked provider if he would like her to be tested again before appointment and he said yes. Patient will arrive 45 minutes early for testing

## 2021-12-01 ENCOUNTER — Ambulatory Visit: Payer: No Typology Code available for payment source | Admitting: Family Medicine

## 2021-12-16 ENCOUNTER — Ambulatory Visit (INDEPENDENT_AMBULATORY_CARE_PROVIDER_SITE_OTHER): Payer: PRIVATE HEALTH INSURANCE | Admitting: Family Medicine

## 2021-12-16 ENCOUNTER — Encounter: Payer: Self-pay | Admitting: Family Medicine

## 2021-12-16 VITALS — BP 128/84 | HR 68 | Temp 98.5°F | Ht 67.0 in | Wt 187.0 lb

## 2021-12-16 DIAGNOSIS — E785 Hyperlipidemia, unspecified: Secondary | ICD-10-CM

## 2021-12-16 DIAGNOSIS — Z0000 Encounter for general adult medical examination without abnormal findings: Secondary | ICD-10-CM

## 2021-12-16 LAB — CBC WITH DIFFERENTIAL/PLATELET
Basophils Absolute: 0 10*3/uL (ref 0.0–0.1)
Basophils Relative: 0.4 % (ref 0.0–3.0)
Eosinophils Absolute: 0 10*3/uL (ref 0.0–0.7)
Eosinophils Relative: 1.1 % (ref 0.0–5.0)
HCT: 42.9 % (ref 36.0–46.0)
Hemoglobin: 13.7 g/dL (ref 12.0–15.0)
Lymphocytes Relative: 40.9 % (ref 12.0–46.0)
Lymphs Abs: 1.6 10*3/uL (ref 0.7–4.0)
MCHC: 31.8 g/dL (ref 30.0–36.0)
MCV: 80.2 fl (ref 78.0–100.0)
Monocytes Absolute: 0.3 10*3/uL (ref 0.1–1.0)
Monocytes Relative: 7.4 % (ref 3.0–12.0)
Neutro Abs: 2 10*3/uL (ref 1.4–7.7)
Neutrophils Relative %: 50.2 % (ref 43.0–77.0)
Platelets: 284 10*3/uL (ref 150.0–400.0)
RBC: 5.35 Mil/uL — ABNORMAL HIGH (ref 3.87–5.11)
RDW: 15.5 % (ref 11.5–15.5)
WBC: 4 10*3/uL (ref 4.0–10.5)

## 2021-12-16 LAB — TSH: TSH: 0.79 u[IU]/mL (ref 0.35–5.50)

## 2021-12-16 LAB — LIPID PANEL
Cholesterol: 174 mg/dL (ref 0–200)
HDL: 57.8 mg/dL (ref >39.00)
LDL Cholesterol: 106 mg/dL — ABNORMAL HIGH (ref 0–99)
NonHDL: 116.34
Total CHOL/HDL Ratio: 3
Triglycerides: 54 mg/dL (ref 0.0–149.0)
VLDL: 10.8 mg/dL (ref 0.0–40.0)

## 2021-12-16 LAB — HEPATIC FUNCTION PANEL
ALT: 28 U/L (ref 0–35)
AST: 26 U/L (ref 0–37)
Albumin: 4.1 g/dL (ref 3.5–5.2)
Alkaline Phosphatase: 67 U/L (ref 39–117)
Bilirubin, Direct: 0.1 mg/dL (ref 0.0–0.3)
Total Bilirubin: 0.5 mg/dL (ref 0.2–1.2)
Total Protein: 7.6 g/dL (ref 6.0–8.3)

## 2021-12-16 LAB — BASIC METABOLIC PANEL
BUN: 16 mg/dL (ref 6–23)
CO2: 32 mEq/L (ref 19–32)
Calcium: 9.8 mg/dL (ref 8.4–10.5)
Chloride: 101 mEq/L (ref 96–112)
Creatinine, Ser: 0.9 mg/dL (ref 0.40–1.20)
GFR: 71.05 mL/min (ref >60.00)
Glucose, Bld: 92 mg/dL (ref 70–99)
Potassium: 3.5 mEq/L (ref 3.5–5.1)
Sodium: 140 mEq/L (ref 135–145)

## 2021-12-16 LAB — HEMOGLOBIN A1C: Hgb A1c MFr Bld: 6.9 % — ABNORMAL HIGH (ref 4.6–6.5)

## 2021-12-16 MED ORDER — FLUCONAZOLE 150 MG PO TABS: 150.0000 mg | ORAL_TABLET | Freq: Every day | ORAL | 5 refills | Status: DC

## 2021-12-16 MED ORDER — AMLODIPINE BESYLATE 5 MG PO TABS: 5.0000 mg | ORAL_TABLET | Freq: Every day | ORAL | 3 refills | Status: DC

## 2021-12-16 MED ORDER — TRIAMTERENE-HCTZ 37.5-25 MG PO CAPS: ORAL_CAPSULE | ORAL | 3 refills | Status: DC

## 2021-12-16 MED ORDER — EZETIMIBE 10 MG PO TABS: ORAL_TABLET | ORAL | 3 refills | Status: DC

## 2021-12-16 MED ORDER — VALACYCLOVIR HCL 500 MG PO TABS: ORAL_TABLET | ORAL | 3 refills | Status: DC

## 2021-12-16 NOTE — Progress Notes (Signed)
° °  Subjective:    Patient ID: Morgan Doyle, female    DOB: 1966/07/10, 56 y.o.   MRN: [id number]  HPI Here for a well exam. She feels fine. She was treated for a bronchitis several weeks ago, but this has totally resolved.    Review of Systems  Constitutional: Negative.   HENT: Negative.    Eyes: Negative.   Respiratory: Negative.    Cardiovascular: Negative.   Gastrointestinal: Negative.   Genitourinary:  Negative for decreased urine volume, difficulty urinating, dyspareunia, dysuria, enuresis, flank pain, frequency, hematuria, pelvic pain and urgency.  Musculoskeletal: Negative.   Skin: Negative.   Neurological: Negative.  Negative for headaches.  Psychiatric/Behavioral: Negative.        Objective:   Physical Exam Constitutional:      General: She is not in acute distress.    Appearance: Normal appearance. She is well-developed.  HENT:     Head: Normocephalic and atraumatic.     Right Ear: External ear normal.     Left Ear: External ear normal.     Nose: Nose normal.     Mouth/Throat:     Pharynx: No oropharyngeal exudate.  Eyes:     General: No scleral icterus.    Conjunctiva/sclera: Conjunctivae normal.     Pupils: Pupils are equal, round, and reactive to light.  Neck:     Thyroid: No thyromegaly.     Vascular: No JVD.  Cardiovascular:     Rate and Rhythm: Normal rate and regular rhythm.     Heart sounds: Normal heart sounds. No murmur heard.   No friction rub. No gallop.  Pulmonary:     Effort: Pulmonary effort is normal. No respiratory distress.     Breath sounds: Normal breath sounds. No wheezing or rales.  Chest:     Chest wall: No tenderness.  Abdominal:     General: Bowel sounds are normal. There is no distension.     Palpations: Abdomen is soft. There is no mass.     Tenderness: There is no abdominal tenderness. There is no guarding or rebound.  Musculoskeletal:        General: No tenderness. Normal range of motion.     Cervical back: Normal range  of motion and neck supple.  Lymphadenopathy:     Cervical: No cervical adenopathy.  Skin:    General: Skin is warm and dry.     Findings: No erythema or rash.  Neurological:     Mental Status: She is alert and oriented to person, place, and time.     Cranial Nerves: No cranial nerve deficit.     Motor: No abnormal muscle tone.     Coordination: Coordination normal.     Deep Tendon Reflexes: Reflexes are normal and symmetric. Reflexes normal.  Psychiatric:        Behavior: Behavior normal.        Thought Content: Thought content normal.        Judgment: Judgment normal.          Assessment & Plan:  Well exam. We discussed diet and exercise. Get fasting labs. Alysia Penna, MD

## 2022-01-11 ENCOUNTER — Encounter: Payer: Self-pay | Admitting: Family Medicine

## 2022-04-05 ENCOUNTER — Other Ambulatory Visit: Payer: Self-pay | Admitting: Family Medicine

## 2022-04-05 ENCOUNTER — Other Ambulatory Visit: Payer: Self-pay | Admitting: Obstetrics and Gynecology

## 2022-04-05 DIAGNOSIS — Z1231 Encounter for screening mammogram for malignant neoplasm of breast: Secondary | ICD-10-CM

## 2022-04-08 ENCOUNTER — Ambulatory Visit
Admission: RE | Admit: 2022-04-08 | Discharge: 2022-04-08 | Disposition: A | Discharge status: Home / Self Care | Payer: PRIVATE HEALTH INSURANCE | Source: Ambulatory Visit | Attending: Family Medicine | Admitting: Family Medicine

## 2022-04-08 DIAGNOSIS — Z1231 Encounter for screening mammogram for malignant neoplasm of breast: Secondary | ICD-10-CM

## 2022-10-18 IMAGING — MG MM DIGITAL SCREENING BILAT W/ TOMO AND CAD
8 series · 9 of 24 positions shown · non-contrast
Comparison: Previous exam(s).

CLINICAL DATA: Screening.

EXAM:
DIGITAL SCREENING BILATERAL MAMMOGRAM WITH TOMOSYNTHESIS AND CAD
TECHNIQUE: Bilateral screening digital craniocaudal and mediolateral oblique
mammograms were obtained. Bilateral screening digital breast
tomosynthesis was performed. The images were evaluated with
computer-aided detection.

[L CC synth-2D]
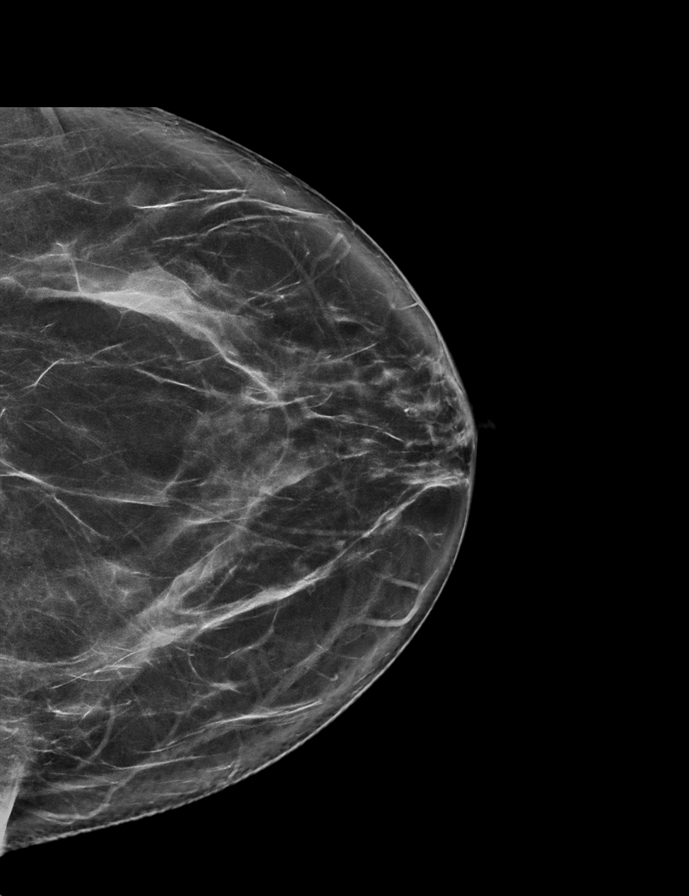

[R CC synth-2D]
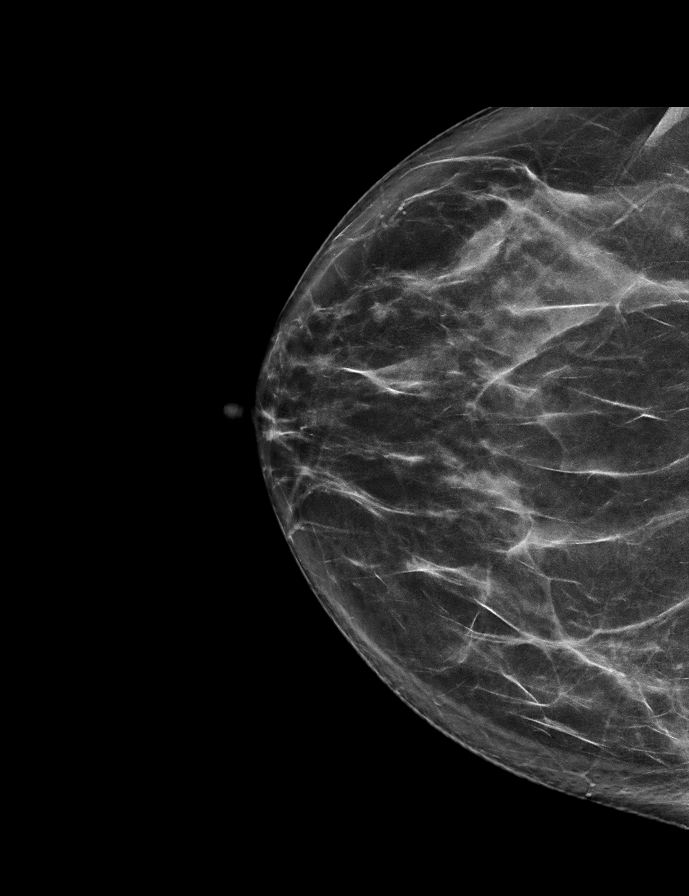

[L MLO synth-2D]
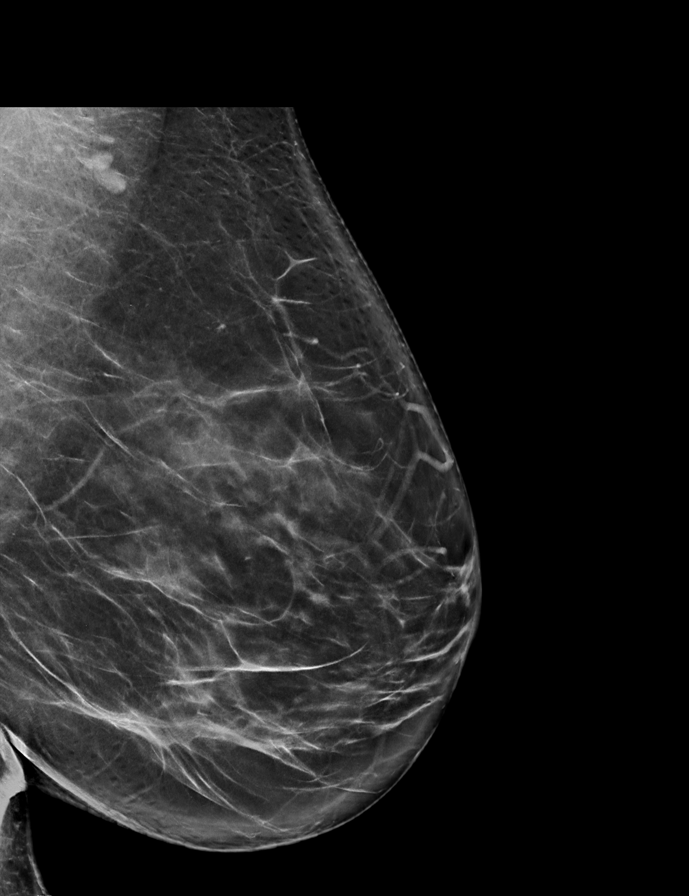

[R MLO synth-2D]
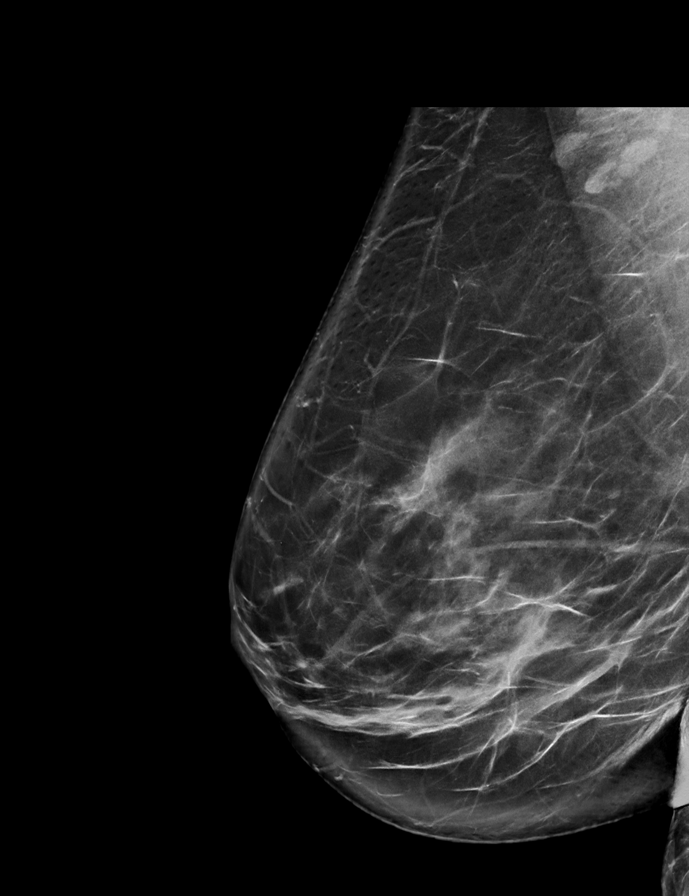

[R MLO tomo · 2 of 84 frames shown]
[frame 28/84]
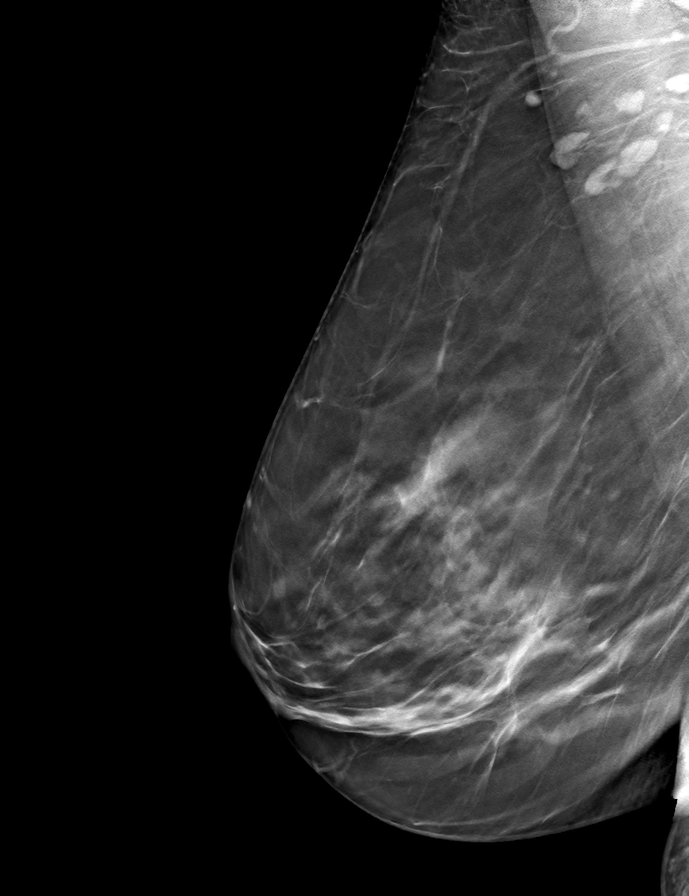
[frame 43/84]
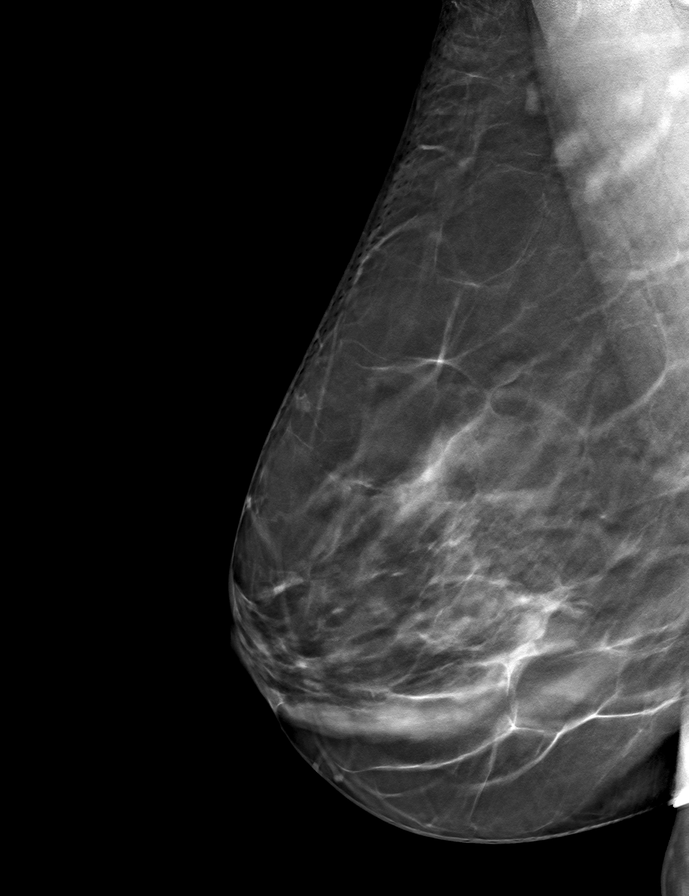

[L CC tomo · tomo slice 39/77.0]
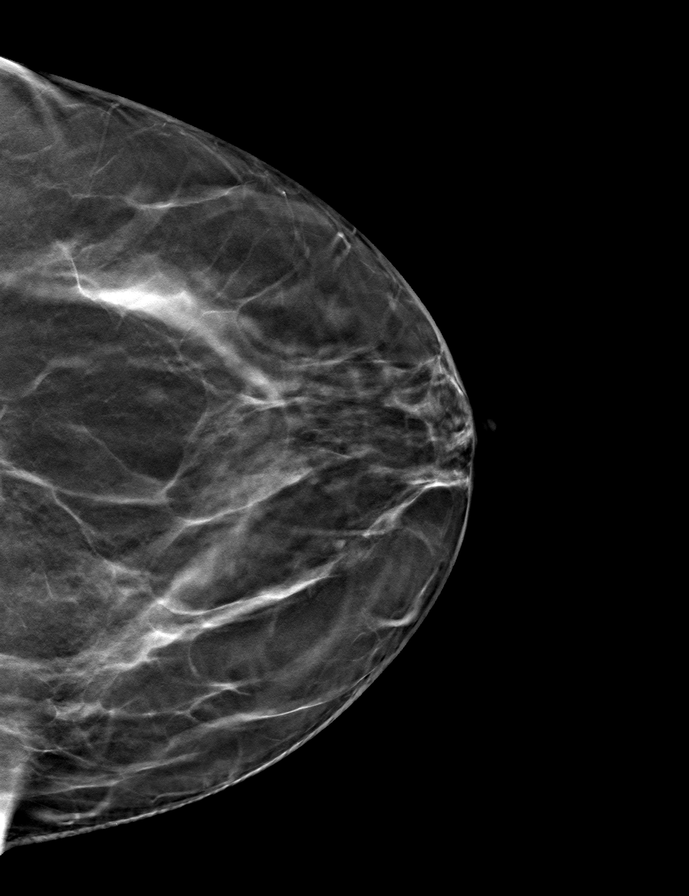

[R CC tomo · tomo slice 37/74.0]
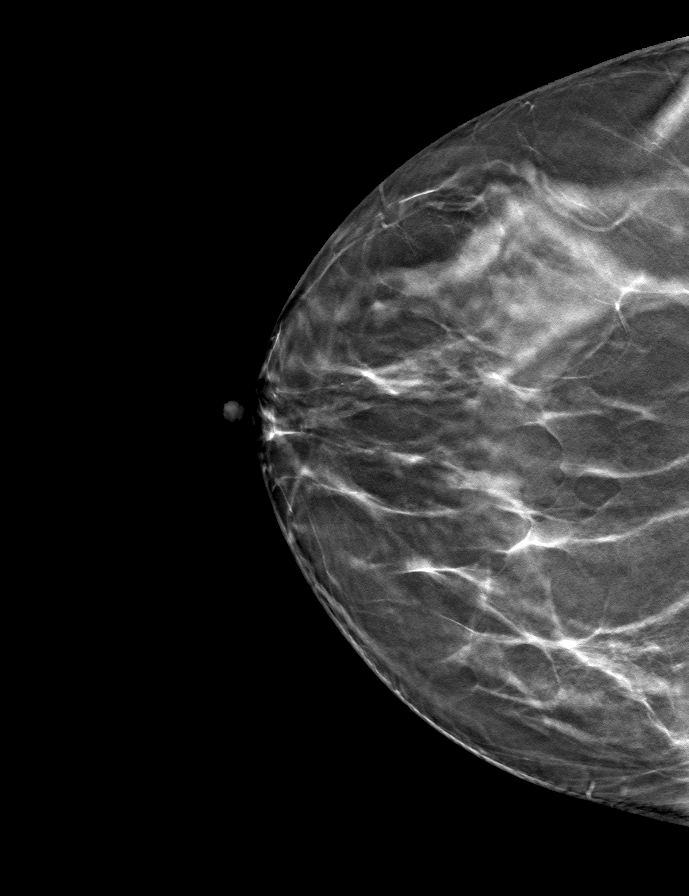

[L MLO tomo · tomo slice 41/82.0]
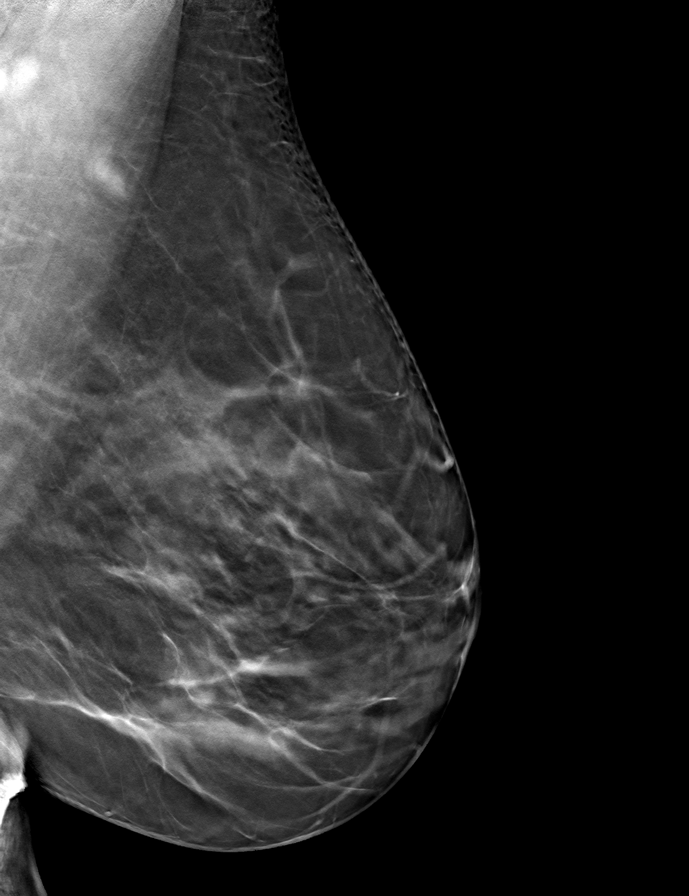

[9 of 24 positions shown; findings below may reference images not displayed]

ACR Breast Density Category b: There are scattered areas of
fibroglandular density.
FINDINGS: There are no findings suspicious for malignancy.
IMPRESSION: No mammographic evidence of malignancy. A result letter of this
screening mammogram will be mailed directly to the patient.

RECOMMENDATION:
Screening mammogram in one year. (Code:51-O-LD2)

BI-RADS CATEGORY  1: Negative.

## 2022-11-11 ENCOUNTER — Ambulatory Visit: Payer: PRIVATE HEALTH INSURANCE | Admitting: Family Medicine

## 2022-11-11 ENCOUNTER — Encounter: Payer: Self-pay | Admitting: Family Medicine

## 2022-11-11 VITALS — BP 130/90 | HR 77 | Temp 99.1°F | Wt 191.0 lb

## 2022-11-11 DIAGNOSIS — E538 Deficiency of other specified B group vitamins: Secondary | ICD-10-CM | POA: Diagnosis not present

## 2022-11-11 DIAGNOSIS — M791 Myalgia, unspecified site: Secondary | ICD-10-CM

## 2022-11-11 DIAGNOSIS — E559 Vitamin D deficiency, unspecified: Secondary | ICD-10-CM | POA: Diagnosis not present

## 2022-11-11 LAB — CBC WITH DIFFERENTIAL/PLATELET
Basophils Absolute: 0 10*3/uL (ref 0.0–0.1)
Basophils Relative: 0.6 % (ref 0.0–3.0)
Eosinophils Absolute: 0.1 10*3/uL (ref 0.0–0.7)
Eosinophils Relative: 1.6 % (ref 0.0–5.0)
HCT: 44.6 % (ref 36.0–46.0)
Hemoglobin: 14.5 g/dL (ref 12.0–15.0)
Lymphocytes Relative: 48.4 % — ABNORMAL HIGH (ref 12.0–46.0)
Lymphs Abs: 2 10*3/uL (ref 0.7–4.0)
MCHC: 32.4 g/dL (ref 30.0–36.0)
MCV: 79.7 fl (ref 78.0–100.0)
Monocytes Absolute: 0.4 10*3/uL (ref 0.1–1.0)
Monocytes Relative: 8.7 % (ref 3.0–12.0)
Neutro Abs: 1.7 10*3/uL (ref 1.4–7.7)
Neutrophils Relative %: 40.7 % — ABNORMAL LOW (ref 43.0–77.0)
Platelets: 276 10*3/uL (ref 150.0–400.0)
RBC: 5.6 Mil/uL — ABNORMAL HIGH (ref 3.87–5.11)
RDW: 15.9 % — ABNORMAL HIGH (ref 11.5–15.5)
WBC: 4.2 10*3/uL (ref 4.0–10.5)

## 2022-11-11 LAB — BASIC METABOLIC PANEL
BUN: 19 mg/dL (ref 6–23)
CO2: 30 mEq/L (ref 19–32)
Calcium: 10.4 mg/dL (ref 8.4–10.5)
Chloride: 100 mEq/L (ref 96–112)
Creatinine, Ser: 0.9 mg/dL (ref 0.40–1.20)
GFR: 70.6 mL/min (ref >60.00)
Glucose, Bld: 111 mg/dL — ABNORMAL HIGH (ref 70–99)
Potassium: 3.3 mEq/L — ABNORMAL LOW (ref 3.5–5.1)
Sodium: 138 mEq/L (ref 135–145)

## 2022-11-11 LAB — VITAMIN B12: Vitamin B-12: 457 pg/mL (ref 211–911)

## 2022-11-11 LAB — SEDIMENTATION RATE: Sed Rate: 30 mm/hr (ref 0–30)

## 2022-11-11 LAB — VITAMIN D 25 HYDROXY (VIT D DEFICIENCY, FRACTURES): VITD: 42.92 ng/mL (ref 30.00–100.00)

## 2022-11-11 LAB — C-REACTIVE PROTEIN: CRP: <1 mg/dL (ref 0.5–20.0)

## 2022-11-11 LAB — HEPATIC FUNCTION PANEL
ALT: 26 U/L (ref 0–35)
AST: 25 U/L (ref 0–37)
Albumin: 4.6 g/dL (ref 3.5–5.2)
Alkaline Phosphatase: 78 U/L (ref 39–117)
Bilirubin, Direct: 0.1 mg/dL (ref 0.0–0.3)
Total Bilirubin: 0.4 mg/dL (ref 0.2–1.2)
Total Protein: 8 g/dL (ref 6.0–8.3)

## 2022-11-11 LAB — CK: Total CK: 154 U/L (ref 7–177)

## 2022-11-11 LAB — TSH: TSH: 1.03 u[IU]/mL (ref 0.35–5.50)

## 2022-11-11 NOTE — Progress Notes (Signed)
   Subjective:    Patient ID: Morgan Doyle, female    DOB: 1966-04-22, 56 y.o.   MRN: [id number]  HPI Here for the rather sudden onset 6 weeks ago of diffuse muscle aches with pain in the hands and feet. She has had mild swelling in the hands and ankles as well. No fever or rashes. No SOB. No recent medication changes. She is taking Tylenol or Ibuprofen as needed. She also describes som e generalized fatigue. No change in sleep or appetite.    Review of Systems  Constitutional:  Positive for fatigue. Negative for fever.  Respiratory: Negative.    Cardiovascular:  Positive for leg swelling. Negative for chest pain and palpitations.  Gastrointestinal: Negative.   Genitourinary: Negative.   Musculoskeletal:  Positive for arthralgias and myalgias.  Neurological: Negative.        Objective:   Physical Exam Constitutional:      General: She is not in acute distress.    Appearance: Normal appearance.  Cardiovascular:     Rate and Rhythm: Normal rate and regular rhythm.     Pulses: Normal pulses.     Heart sounds: Normal heart sounds.  Pulmonary:     Effort: Pulmonary effort is normal.     Breath sounds: Normal breath sounds.  Musculoskeletal:     Right lower leg: No edema.     Left lower leg: No edema.  Skin:    Findings: No erythema or rash.  Neurological:     General: No focal deficit present.     Mental Status: She is alert and oriented to person, place, and time.           Assessment & Plan:  She is having myalgias and fatigue of uncertain etioligy. She will continue to use Tylenol and Ibuprofen as needed. We will get labs today to check CBC, CK, ESR, CRP, etc.  Alysia Penna, MD

## 2022-11-12 LAB — RHEUMATOID FACTOR: Rheumatoid fact SerPl-aCnc: 19 IU/mL — ABNORMAL HIGH (ref <14)

## 2022-11-15 ENCOUNTER — Other Ambulatory Visit: Payer: Self-pay

## 2022-11-15 DIAGNOSIS — E876 Hypokalemia: Secondary | ICD-10-CM

## 2022-11-15 MED ORDER — POTASSIUM CHLORIDE CRYS ER 10 MEQ PO TBCR: 10.0000 meq | EXTENDED_RELEASE_TABLET | Freq: Two times a day (BID) | ORAL | 3 refills | Status: DC

## 2022-11-17 ENCOUNTER — Encounter: Payer: Self-pay | Admitting: Family Medicine

## 2022-11-18 NOTE — Telephone Encounter (Signed)
She can safely take these meds together, we just have to monitor her potassium levels. We have set up for her to get labs in 30 days

## 2022-12-09 ENCOUNTER — Other Ambulatory Visit: Payer: Self-pay | Admitting: Obstetrics and Gynecology

## 2022-12-09 DIAGNOSIS — Z1231 Encounter for screening mammogram for malignant neoplasm of breast: Secondary | ICD-10-CM

## 2022-12-19 ENCOUNTER — Ambulatory Visit (INDEPENDENT_AMBULATORY_CARE_PROVIDER_SITE_OTHER): Payer: PRIVATE HEALTH INSURANCE | Admitting: Family Medicine

## 2022-12-19 ENCOUNTER — Encounter: Payer: Self-pay | Admitting: Family Medicine

## 2022-12-19 VITALS — BP 120/88 | HR 73 | Temp 98.0°F | Ht 66.25 in | Wt 194.0 lb

## 2022-12-19 DIAGNOSIS — M2550 Pain in unspecified joint: Secondary | ICD-10-CM | POA: Diagnosis not present

## 2022-12-19 DIAGNOSIS — E785 Hyperlipidemia, unspecified: Secondary | ICD-10-CM | POA: Diagnosis not present

## 2022-12-19 DIAGNOSIS — Z0000 Encounter for general adult medical examination without abnormal findings: Secondary | ICD-10-CM

## 2022-12-19 LAB — BASIC METABOLIC PANEL
BUN: 19 mg/dL (ref 6–23)
CO2: 32 mEq/L (ref 19–32)
Calcium: 10.3 mg/dL (ref 8.4–10.5)
Chloride: 101 mEq/L (ref 96–112)
Creatinine, Ser: 0.87 mg/dL (ref 0.40–1.20)
GFR: 73.47 mL/min (ref >60.00)
Glucose, Bld: 102 mg/dL — ABNORMAL HIGH (ref 70–99)
Potassium: 3.6 mEq/L (ref 3.5–5.1)
Sodium: 141 mEq/L (ref 135–145)

## 2022-12-19 LAB — LIPID PANEL
Cholesterol: 180 mg/dL (ref 0–200)
HDL: 52.5 mg/dL (ref >39.00)
LDL Cholesterol: 117 mg/dL — ABNORMAL HIGH (ref 0–99)
NonHDL: 127.72
Total CHOL/HDL Ratio: 3
Triglycerides: 56 mg/dL (ref 0.0–149.0)
VLDL: 11.2 mg/dL (ref 0.0–40.0)

## 2022-12-19 MED ORDER — AMLODIPINE BESYLATE 5 MG PO TABS: 5.0000 mg | ORAL_TABLET | Freq: Every day | ORAL | 3 refills | Status: DC

## 2022-12-19 MED ORDER — MELOXICAM 15 MG PO TABS: 15.0000 mg | ORAL_TABLET | Freq: Every day | ORAL | 3 refills | Status: DC

## 2022-12-19 MED ORDER — VALACYCLOVIR HCL 500 MG PO TABS: ORAL_TABLET | ORAL | 3 refills | Status: DC

## 2022-12-19 MED ORDER — EZETIMIBE 10 MG PO TABS: ORAL_TABLET | ORAL | 3 refills | Status: DC

## 2022-12-19 MED ORDER — TRIAMTERENE-HCTZ 37.5-25 MG PO CAPS: ORAL_CAPSULE | ORAL | 3 refills | Status: DC

## 2022-12-19 NOTE — Progress Notes (Signed)
Subjective:    Patient ID: Morgan Doyle, female    DOB: 1966-02-18, 57 y.o.   MRN: [id number]  HPI Here for a well exam. She is doing well except for aching pains all over her body. We discussed this at her OV last month, and we did extensive lab work. The only things that were abnormal were a low potassium and a slightly elevated RF. Her CRP and ESR were normal. We have corrected the potassium, but the pains continue to plague her. She takes Ibuprofen and ES Tylenol every day.    Review of Systems  Constitutional: Negative.   HENT: Negative.    Eyes: Negative.   Respiratory: Negative.    Cardiovascular: Negative.   Gastrointestinal: Negative.   Genitourinary:  Negative for decreased urine volume, difficulty urinating, dyspareunia, dysuria, enuresis, flank pain, frequency, hematuria, pelvic pain and urgency.  Musculoskeletal:  Positive for arthralgias.  Skin: Negative.   Neurological: Negative.  Negative for headaches.  Psychiatric/Behavioral: Negative.         Objective:   Physical Exam Constitutional:      General: She is not in acute distress.    Appearance: Normal appearance. She is well-developed.  HENT:     Head: Normocephalic and atraumatic.     Right Ear: External ear normal.     Left Ear: External ear normal.     Nose: Nose normal.     Mouth/Throat:     Pharynx: No oropharyngeal exudate.  Eyes:     General: No scleral icterus.    Conjunctiva/sclera: Conjunctivae normal.     Pupils: Pupils are equal, round, and reactive to light.  Neck:     Thyroid: No thyromegaly.     Vascular: No JVD.  Cardiovascular:     Rate and Rhythm: Normal rate and regular rhythm.     Heart sounds: Normal heart sounds. No murmur heard.    No friction rub. No gallop.  Pulmonary:     Effort: Pulmonary effort is normal. No respiratory distress.     Breath sounds: Normal breath sounds. No wheezing or rales.  Chest:     Chest wall: No tenderness.  Abdominal:     General: Bowel  sounds are normal. There is no distension.     Palpations: Abdomen is soft. There is no mass.     Tenderness: There is no abdominal tenderness. There is no guarding or rebound.  Musculoskeletal:        General: No tenderness. Normal range of motion.     Cervical back: Normal range of motion and neck supple.  Lymphadenopathy:     Cervical: No cervical adenopathy.  Skin:    General: Skin is warm and dry.     Findings: No erythema or rash.  Neurological:     Mental Status: She is alert and oriented to person, place, and time.     Cranial Nerves: No cranial nerve deficit.     Motor: No abnormal muscle tone.     Coordination: Coordination normal.     Deep Tendon Reflexes: Reflexes are normal and symmetric. Reflexes normal.  Psychiatric:        Behavior: Behavior normal.        Thought Content: Thought content normal.        Judgment: Judgment normal.           Assessment & Plan:  Well exam. We discussed diet and exercise. Get fasting lipids. We will recheck a BMET. She will try taking a Meloxicam every day  rather than Ibuprofen. She can still add Tylenol as needed. Refer to Rheumatology for evaluation.  Alysia Penna, MD

## 2023-04-10 ENCOUNTER — Inpatient Hospital Stay: Admission: RE | Admit: 2023-04-10 | Payer: PRIVATE HEALTH INSURANCE | Source: Ambulatory Visit

## 2023-04-21 ENCOUNTER — Ambulatory Visit
Admission: RE | Admit: 2023-04-21 | Discharge: 2023-04-21 | Disposition: A | Discharge status: Home / Self Care | Payer: PRIVATE HEALTH INSURANCE | Source: Ambulatory Visit | Attending: Obstetrics and Gynecology | Admitting: Obstetrics and Gynecology

## 2023-04-21 DIAGNOSIS — Z1231 Encounter for screening mammogram for malignant neoplasm of breast: Secondary | ICD-10-CM

## 2023-10-25 ENCOUNTER — Other Ambulatory Visit: Payer: Self-pay | Admitting: Family Medicine

## 2023-10-25 ENCOUNTER — Other Ambulatory Visit: Payer: Self-pay

## 2023-10-25 ENCOUNTER — Telehealth: Payer: Self-pay | Admitting: Family Medicine

## 2023-10-25 MED ORDER — AMLODIPINE BESYLATE 5 MG PO TABS: 5.0000 mg | ORAL_TABLET | Freq: Every day | ORAL | 3 refills | Status: DC

## 2023-10-25 NOTE — Telephone Encounter (Signed)
Prescription Request  10/25/2023  LOV: 12/19/2022 amLODipine (NORVASC) 5 MG tablet  What is the name of the medication or equipment?   Have you contacted your pharmacy to request a refill? Yes   Which pharmacy would you like this sent to?  [facility] - [place], [place] - [facility] [place] [address] Phone: [phone] Fax: [phone]    Patient notified that their request is being sent to the clinical staff for review and that they should receive a response within 2 business days.   Please advise at Mobile [phone] (mobile)

## 2023-10-25 NOTE — Telephone Encounter (Signed)
Pt Rx was sent to her pharmacy. Pt notified

## 2023-10-25 NOTE — Telephone Encounter (Signed)
Pt requesting clarification as to whether she should continue taking this medication

## 2024-01-08 ENCOUNTER — Encounter: Payer: Self-pay | Admitting: Family Medicine

## 2024-01-08 ENCOUNTER — Ambulatory Visit: Payer: PRIVATE HEALTH INSURANCE | Admitting: Family Medicine

## 2024-01-08 ENCOUNTER — Ambulatory Visit: Payer: Self-pay | Admitting: Family Medicine

## 2024-01-08 VITALS — BP 124/94 | HR 72 | Temp 97.8°F | Resp 18 | Ht 66.25 in | Wt 198.0 lb

## 2024-01-08 DIAGNOSIS — J0140 Acute pansinusitis, unspecified: Secondary | ICD-10-CM

## 2024-01-08 LAB — POCT INFLUENZA A/B
Influenza A, POC: NEGATIVE
Influenza B, POC: NEGATIVE

## 2024-01-08 LAB — POC COVID19 BINAXNOW: SARS Coronavirus 2 Ag: NEGATIVE

## 2024-01-08 MED ORDER — AMOXICILLIN-POT CLAVULANATE 875-125 MG PO TABS: 1.0000 | ORAL_TABLET | Freq: Two times a day (BID) | ORAL | 0 refills | Status: DC

## 2024-01-08 MED ORDER — FLUCONAZOLE 150 MG PO TABS: 150.0000 mg | ORAL_TABLET | Freq: Once | ORAL | 0 refills | Status: AC

## 2024-01-08 MED ORDER — ALBUTEROL SULFATE HFA 108 (90 BASE) MCG/ACT IN AERS: 2.0000 | INHALATION_SPRAY | Freq: Four times a day (QID) | RESPIRATORY_TRACT | 0 refills | Status: DC | PRN

## 2024-01-08 MED ORDER — FLUCONAZOLE 150 MG PO TABS: 150.0000 mg | ORAL_TABLET | Freq: Once | ORAL | 0 refills | Status: DC

## 2024-01-08 MED ORDER — METHYLPREDNISOLONE 4 MG PO TBPK: ORAL_TABLET | ORAL | 0 refills | Status: DC

## 2024-01-08 MED ORDER — AMOXICILLIN-POT CLAVULANATE 875-125 MG PO TABS: 1.0000 | ORAL_TABLET | Freq: Two times a day (BID) | ORAL | 0 refills | Status: AC

## 2024-01-08 NOTE — Progress Notes (Signed)
Assessment & Plan:  1. Acute non-recurrent pansinusitis (Primary) Education provided on sinus infections. Encouraged symptom management. Patient taught on the proper technique for use of an Albuterol inhaler; she verbalized understanding.  - POC COVID-19 BinaxNow - POCT Influenza A/B - methylPREDNISolone (MEDROL DOSEPAK) 4 MG TBPK tablet; Use as directed.  Dispense: 21 each; Refill: 0 - albuterol (VENTOLIN HFA) 108 (90 Base) MCG/ACT inhaler; Inhale 2 puffs into the lungs every 6 (six) hours as needed.  Dispense: 18 g; Refill: 0 - amoxicillin-clavulanate (AUGMENTIN) 875-125 MG tablet; Take 1 tablet by mouth 2 (two) times daily for 7 days.  Dispense: 14 tablet; Refill: 0 - fluconazole (DIFLUCAN) 150 MG tablet; Take 1 tablet (150 mg total) by mouth once for 1 dose. May repeat after 3 days if needed.  Dispense: 2 tablet; Refill: 0  Results for orders placed or performed in visit on 01/08/24  POC COVID-19 BinaxNow  Result Value Ref Range   SARS Coronavirus 2 Ag Negative Negative  POCT Influenza A/B  Result Value Ref Range   Influenza A, POC Negative Negative   Influenza B, POC Negative Negative    Follow up plan: Return if symptoms worsen or fail to improve.  Deliah Boston, MSN, APRN, FNP-C  Subjective:  HPI: Morgan Doyle is a 58 y.o. female presenting on 01/08/2024 for Sinusitis (Sinus pressure, HA, ear pain (did fly), congestion= yellow, some body aches, not sure about fever./Started SAT /Just got back from a cruise this morning )  Patient is accompanied by her husband, who she is okay with being present.  Patient complains of head congestion, headache, ear pain/pressure, facial pain/pressure, and body aches . She denies nausea, vomiting, and diarrhea. Onset of symptoms was 2 days ago, unchanged since that time. She is drinking plenty of fluids. Evaluation to date: none. Treatment to date:  Sudafed, Tylenol, Alka-Selzer Plus . She does not smoke.    ROS: Negative unless  specifically indicated above in HPI.   Relevant past medical history reviewed and updated as indicated.   Allergies and medications reviewed and updated.   Current Outpatient Medications:    amLODipine (NORVASC) 5 MG tablet, Take 1 tablet (5 mg total) by mouth daily., Disp: 90 tablet, Rfl: 3   CIMZIA 2 X 200 MG KIT, Inject 200 mg into the skin every 30 (thirty) days. 200 each arm = 400, Disp: , Rfl:    doxycycline (VIBRA-TABS) 100 MG tablet, Take 100 mg by mouth daily., Disp: , Rfl:    ezetimibe (ZETIA) 10 MG tablet, TAKE 1 TABLET BY MOUTH EVERY DAY **MUST USE MAIL ORDER FOR MAITENANCE DRUGS**, Disp: 90 tablet, Rfl: 3   fexofenadine (ALLEGRA) 180 MG tablet, Take 180 mg by mouth once as needed., Disp: , Rfl:    ibuprofen (ADVIL,MOTRIN) 200 MG tablet, Take 200 mg by mouth as needed., Disp: , Rfl:    meloxicam (MOBIC) 15 MG tablet, Take 1 tablet (15 mg total) by mouth daily., Disp: 90 tablet, Rfl: 3   triamterene-hydrochlorothiazide (DYAZIDE) 37.5-25 MG capsule, TAKE 1 CAPSULE BY MOUTH EVERY DAY IN THE MORNING (MAIL ORDER), Disp: 90 capsule, Rfl: 3   valACYclovir (VALTREX) 500 MG tablet, TAKE 1 TABLET BY MOUTH EVERY DAY AS NEEDED, Disp: 90 tablet, Rfl: 3  Allergies  Allergen Reactions   Codeine Nausea And Vomiting    Hallucination-heart racing nausea. Dizziness, stomach pains    Other Hives    NUTS,Pt. STATED SWELLING   Pollen Extract Itching    Itching, sneezing, hives, watery eyes,cough, sneezing  Statins Other (See Comments)    Leg cramps   Tetanus Toxoids     Local redness and fever    Tree Extract     Nuts-throat swells, mouth itching, rash    Latex Rash    Bumps and itching last time at hospital.    Objective:   BP (!) 124/94   Pulse 72   Temp 97.8 F (36.6 C)   Resp 18   Ht 5' 6.25" (1.683 m)   Wt 198 lb (89.8 kg)   LMP 05/10/2011   SpO2 97%   BMI 31.72 kg/m    Physical Exam Vitals reviewed.  Constitutional:      General: She is not in acute distress.     Appearance: Normal appearance. She is not ill-appearing, toxic-appearing or diaphoretic.  HENT:     Head: Normocephalic and atraumatic.     Right Ear: Tympanic membrane, ear canal and external ear normal. There is no impacted cerumen.     Left Ear: Tympanic membrane, ear canal and external ear normal. There is no impacted cerumen.     Nose: Nose normal. No congestion or rhinorrhea.     Right Sinus: No maxillary sinus tenderness or frontal sinus tenderness.     Left Sinus: No maxillary sinus tenderness or frontal sinus tenderness.     Mouth/Throat:     Mouth: Mucous membranes are moist.     Pharynx: Oropharynx is clear. Posterior oropharyngeal erythema present. No oropharyngeal exudate.  Eyes:     General: No scleral icterus.       Right eye: No discharge.        Left eye: No discharge.     Conjunctiva/sclera: Conjunctivae normal.  Cardiovascular:     Rate and Rhythm: Normal rate and regular rhythm.     Heart sounds: Normal heart sounds. No murmur heard.    No friction rub. No gallop.  Pulmonary:     Effort: Pulmonary effort is normal. No respiratory distress.     Breath sounds: No stridor. Rhonchi (throughout) present. No wheezing or rales.  Musculoskeletal:        General: Normal range of motion.     Cervical back: Normal range of motion.  Lymphadenopathy:     Cervical: No cervical adenopathy.  Skin:    General: Skin is warm and dry.     Capillary Refill: Capillary refill takes less than 2 seconds.  Neurological:     General: No focal deficit present.     Mental Status: She is alert and oriented to person, place, and time. Mental status is at baseline.  Psychiatric:        Mood and Affect: Mood normal.        Behavior: Behavior normal.        Thought Content: Thought content normal.        Judgment: Judgment normal.

## 2024-01-08 NOTE — Telephone Encounter (Signed)
  Chief Complaint: Congestion Symptoms: congestion, ear fullness, cough, runny nose, body aches Frequency: approx 1 week Pertinent Negatives: Patient denies SOB, CP Disposition: [] ED /[] Urgent Care (no appt availability in office) / [x] Appointment(In office/virtual)/ []  [place] [facility]/ [] Home Care/ [] Refused Recommended Disposition /[] [place] [facility]/ []  Follow-up with PCP Additional Notes: Patient calls stating she has had congestion, runny nose with yellow mucus, cough, ear fullness x 1 week. States she just returned last night from a 14 day cruise. States she has taken OTC meds with some relief, but symptoms are not improving overall. Per protocol, patient to be evaluated within 24 hours. Patient requests appt in any clinic with availability today. Scheduled at [facility] per patient request for 1320 today. Care advice reviewed, patient verbalized understanding. Alerting PCP for review.   Reason for Disposition  Earache  Answer Assessment - Initial Assessment Questions 1. LOCATION: "Where does it hurt?"      Ears, nose 2. ONSET: "When did the sinus pain start?"  (e.g., hours, days)      No pain, but congestion approx 1 week 3. SEVERITY: "How bad is the pain?"   (Scale 1-10; mild, moderate or severe)   - MILD (1-3): doesn't interfere with normal activities    - MODERATE (4-7): interferes with normal activities (e.g., work or school) or awakens from sleep   - SEVERE (8-10): excruciating pain and patient unable to do any normal activities        Denies pain 4. RECURRENT SYMPTOM: "Have you ever had sinus problems before?" If Yes, ask: "When was the last time?" and "What happened that time?"      Yes, sinusitis, unsure of how long. 5. NASAL CONGESTION: "Is the nose blocked?" If Yes, ask: "Can you open it or must you breathe through your mouth?"     Yes 6. NASAL DISCHARGE: "Do you have discharge from your nose?" If so ask, "What color?"     Yes, yellow 7. FEVER: "Do you  have a fever?" If Yes, ask: "What is it, how was it measured, and when did it start?"      Unsure 8. OTHER SYMPTOMS: "Do you have any other symptoms?" (e.g., sore throat, cough, earache, difficulty breathing)     Ear fullness, congestion, runny nose, sore throat, body aches  Protocols used: Sinus Pain or Congestion-A-AH

## 2024-01-08 NOTE — Patient Instructions (Signed)
Throat lozenges, chloraseptic spray, warm salt water gargles, hot tea/honey, cough syrup (Delsym), Tylenol/Ibuprofen, Vicks, and a humidifier at night.

## 2024-01-09 NOTE — Telephone Encounter (Signed)
Pt was seen yesterday at [facility] for this

## 2024-01-30 ENCOUNTER — Other Ambulatory Visit: Payer: Self-pay | Admitting: Family Medicine

## 2024-01-30 DIAGNOSIS — J0140 Acute pansinusitis, unspecified: Secondary | ICD-10-CM

## 2024-03-11 ENCOUNTER — Other Ambulatory Visit: Payer: Self-pay | Admitting: Family Medicine

## 2024-03-11 DIAGNOSIS — E785 Hyperlipidemia, unspecified: Secondary | ICD-10-CM

## 2024-03-12 MED ORDER — EZETIMIBE 10 MG PO TABS: ORAL_TABLET | ORAL | 3 refills | Status: AC

## 2024-03-12 MED ORDER — TRIAMTERENE-HCTZ 37.5-25 MG PO CAPS: ORAL_CAPSULE | ORAL | 3 refills | Status: AC

## 2024-03-12 NOTE — Telephone Encounter (Signed)
 Done

## 2024-04-15 ENCOUNTER — Other Ambulatory Visit: Payer: Self-pay | Admitting: Obstetrics and Gynecology

## 2024-04-15 DIAGNOSIS — Z1231 Encounter for screening mammogram for malignant neoplasm of breast: Secondary | ICD-10-CM

## 2024-05-07 ENCOUNTER — Ambulatory Visit
Admission: RE | Admit: 2024-05-07 | Discharge: 2024-05-07 | Disposition: A | Discharge status: Home / Self Care | Payer: PRIVATE HEALTH INSURANCE | Source: Ambulatory Visit | Attending: Obstetrics and Gynecology | Admitting: Obstetrics and Gynecology

## 2024-05-07 DIAGNOSIS — Z1231 Encounter for screening mammogram for malignant neoplasm of breast: Secondary | ICD-10-CM

## 2024-05-10 ENCOUNTER — Other Ambulatory Visit: Payer: Self-pay | Admitting: Obstetrics and Gynecology

## 2024-05-10 DIAGNOSIS — R928 Other abnormal and inconclusive findings on diagnostic imaging of breast: Secondary | ICD-10-CM

## 2024-06-03 ENCOUNTER — Ambulatory Visit
Admission: RE | Admit: 2024-06-03 | Discharge: 2024-06-03 | Disposition: A | Discharge status: Home / Self Care | Payer: PRIVATE HEALTH INSURANCE | Source: Ambulatory Visit | Attending: Obstetrics and Gynecology | Admitting: Obstetrics and Gynecology

## 2024-06-03 ENCOUNTER — Ambulatory Visit
Admission: RE | Admit: 2024-06-03 | Discharge: 2024-06-03 | Discharge status: Home / Self Care | Payer: PRIVATE HEALTH INSURANCE | Source: Ambulatory Visit | Attending: Obstetrics and Gynecology | Admitting: Obstetrics and Gynecology

## 2024-06-03 DIAGNOSIS — R928 Other abnormal and inconclusive findings on diagnostic imaging of breast: Secondary | ICD-10-CM

## 2024-06-13 ENCOUNTER — Other Ambulatory Visit: Payer: Self-pay | Admitting: Obstetrics and Gynecology

## 2024-06-13 DIAGNOSIS — N6310 Unspecified lump in the right breast, unspecified quadrant: Secondary | ICD-10-CM

## 2024-07-24 ENCOUNTER — Ambulatory Visit: Payer: Self-pay

## 2024-07-24 NOTE — Telephone Encounter (Signed)
 FYI Only or Action Required?: FYI only for provider.  Patient was last seen in primary care on 01/08/2024 by Merlynn Niki FALCON, FNP.  Called Nurse Triage reporting Chest Pain.  Symptoms began several weeks ago.  Interventions attempted: Prescription medications: prilosec.  Symptoms are: gradually improving.  Triage Disposition: See PCP Within 2 Weeks, See Physician Within 24 Hours, See PCP When Office is Open (Within 3 Days)  Patient/caregiver understands and will follow disposition?: Yes       Copied from CRM #[id number]. Topic: Appointments - Appointment Scheduling >> Jul 24, 2024 10:51 AM Morgan Doyle wrote: Patient/patient representative is calling to schedule an appointment. Refer to attachments for appointment information. Reason for Disposition  [1] Chest pain from known angina comes and goes AND [2] is NOT happening more often (increasing in frequency) or getting worse (increasing in severity)  [1] Chest pain lasts > 5 minutes AND [2] occurred > 3 days ago (72 hours) AND [3] NO chest pain or cardiac symptoms now  [1] Blurred vision or visual changes AND [2] gradual onset (e.g., weeks, months)  Answer Assessment - Initial Assessment Questions 1. LOCATION: Where does it hurt?       sternum 2. RADIATION: Does the pain go anywhere else? (e.g., into neck, jaw, arms, back)     No  3. ONSET: When did the chest pain begin? (Minutes, hours or days)      Last week, not currently experiencing 4. PATTERN: Does the pain come and go, or has it been constant since it started?  Does it get worse with exertion?      Would come and go, not currently experiencing 5. DURATION: How long does it last (e.g., seconds, minutes, hours)     In spurs,  6. SEVERITY: How bad is the pain?  (e.g., Scale 1-10; mild, moderate, or severe)     No pain currently  7. CARDIAC RISK FACTORS: Do you have any history of heart problems or risk factors for heart disease? (e.g., angina, prior heart  attack; diabetes, high blood pressure, high cholesterol, smoker, or strong family history of heart disease)     Family hx of heart disease 8. PULMONARY RISK FACTORS: Do you have any history of lung disease?  (e.g., blood clots in lung, asthma, emphysema, birth control pills)     no 9. CAUSE: What do you think is causing the chest pain?     unknown 10. OTHER SYMPTOMS: Do you have any other symptoms? (e.g., dizziness, nausea, vomiting, sweating, fever, difficulty breathing, cough)       Blurry vision, itching, bumps  Answer Assessment - Initial Assessment Questions 1. DESCRIPTION: How has your vision changed? (e.g., complete vision loss, blurred vision, double vision, floaters, etc.)     Blurred vision 2. LOCATION: One or both eyes? If one, ask: Which eye?     Doesn't know 3. SEVERITY: Can you see anything? If Yes, ask: What can you see? (e.g., fine print)     Not when having the blurred vision 4. ONSET: When did this begin? Did it start suddenly or has this been gradual?     2 weeks ago  5. PATTERN: Does this come and go, or has it been constant since it started?     Comes and goes 6. PAIN: Is there any pain in your eye(s)?  (Scale 1-10; or mild, moderate, severe)     no 7. CONTACTS-GLASSES: Do you wear contacts or glasses?     no 8. CAUSE: What do you think  is causing this visual problem?     unknown 9. OTHER SYMPTOMS: Do you have any other symptoms? (e.g., confusion, headache, arm or leg weakness, speech problems)     headache  Protocols used: Chest Pain-A-AH, Vision Loss or Change-A-AH

## 2024-07-24 NOTE — Telephone Encounter (Signed)
 Appt scheduled for 8/14.

## 2024-07-25 ENCOUNTER — Encounter: Payer: Self-pay | Admitting: Family Medicine

## 2024-07-25 ENCOUNTER — Ambulatory Visit: Payer: PRIVATE HEALTH INSURANCE | Admitting: Family Medicine

## 2024-07-25 VITALS — BP 120/90 | HR 62 | Temp 98.2°F | Wt 190.0 lb

## 2024-07-25 DIAGNOSIS — M940 Chondrocostal junction syndrome [Tietze]: Secondary | ICD-10-CM | POA: Diagnosis not present

## 2024-07-25 DIAGNOSIS — I10 Essential (primary) hypertension: Secondary | ICD-10-CM | POA: Diagnosis not present

## 2024-07-25 DIAGNOSIS — R0602 Shortness of breath: Secondary | ICD-10-CM | POA: Diagnosis not present

## 2024-07-25 DIAGNOSIS — E119 Type 2 diabetes mellitus without complications: Secondary | ICD-10-CM | POA: Diagnosis not present

## 2024-07-25 DIAGNOSIS — H538 Other visual disturbances: Secondary | ICD-10-CM

## 2024-07-25 LAB — HEPATIC FUNCTION PANEL
ALT: 21 U/L (ref 0–35)
AST: 30 U/L (ref 0–37)
Albumin: 4.2 g/dL (ref 3.5–5.2)
Alkaline Phosphatase: 66 U/L (ref 39–117)
Bilirubin, Direct: 0.1 mg/dL (ref 0.0–0.3)
Total Bilirubin: 0.5 mg/dL (ref 0.2–1.2)
Total Protein: 7.2 g/dL (ref 6.0–8.3)

## 2024-07-25 LAB — LIPID PANEL
Cholesterol: 165 mg/dL (ref 0–200)
HDL: 48.4 mg/dL (ref >39.00)
LDL Cholesterol: 108 mg/dL — ABNORMAL HIGH (ref 0–99)
NonHDL: 117
Total CHOL/HDL Ratio: 3
Triglycerides: 44 mg/dL (ref 0.0–149.0)
VLDL: 8.8 mg/dL (ref 0.0–40.0)

## 2024-07-25 LAB — CBC WITH DIFFERENTIAL/PLATELET
Basophils Absolute: 0 K/uL (ref 0.0–0.1)
Basophils Relative: 0.5 % (ref 0.0–3.0)
Eosinophils Absolute: 0 K/uL (ref 0.0–0.7)
Eosinophils Relative: 0.9 % (ref 0.0–5.0)
HCT: 43.5 % (ref 36.0–46.0)
Hemoglobin: 13.8 g/dL (ref 12.0–15.0)
Lymphocytes Relative: 57.7 % — ABNORMAL HIGH (ref 12.0–46.0)
Lymphs Abs: 1.8 K/uL (ref 0.7–4.0)
MCHC: 31.6 g/dL (ref 30.0–36.0)
MCV: 79.4 fl (ref 78.0–100.0)
Monocytes Absolute: 0.3 K/uL (ref 0.1–1.0)
Monocytes Relative: 9.9 % (ref 3.0–12.0)
Neutro Abs: 0.9 K/uL — ABNORMAL LOW (ref 1.4–7.7)
Neutrophils Relative %: 31 % — ABNORMAL LOW (ref 43.0–77.0)
Platelets: 246 K/uL (ref 150.0–400.0)
RBC: 5.48 Mil/uL — ABNORMAL HIGH (ref 3.87–5.11)
RDW: 16 % — ABNORMAL HIGH (ref 11.5–15.5)
WBC: 3 K/uL — ABNORMAL LOW (ref 4.0–10.5)

## 2024-07-25 LAB — HEMOGLOBIN A1C: Hgb A1c MFr Bld: 6.9 % — ABNORMAL HIGH (ref 4.6–6.5)

## 2024-07-25 LAB — BASIC METABOLIC PANEL WITH GFR
BUN: 17 mg/dL (ref 6–23)
CO2: 33 meq/L — ABNORMAL HIGH (ref 19–32)
Calcium: 9.7 mg/dL (ref 8.4–10.5)
Chloride: 102 meq/L (ref 96–112)
Creatinine, Ser: 0.92 mg/dL (ref 0.40–1.20)
GFR: 67.94 mL/min (ref >60.00)
Glucose, Bld: 92 mg/dL (ref 70–99)
Potassium: 3.1 meq/L — ABNORMAL LOW (ref 3.5–5.1)
Sodium: 141 meq/L (ref 135–145)

## 2024-07-25 LAB — TSH: TSH: 0.64 u[IU]/mL (ref 0.35–5.50)

## 2024-07-25 MED ORDER — VALACYCLOVIR HCL 500 MG PO TABS: ORAL_TABLET | ORAL | 3 refills | Status: AC

## 2024-07-25 NOTE — Progress Notes (Signed)
   Subjective:    Patient ID: Morgan Doyle, female    DOB: September 20, 1966, 58 y.o.   MRN: [id number]  HPI Here for 4 weeks of intermittent symptoms like chest pain and SOB. The chest pains are sharp in quality, and they are made worse when she lifts weights or does pushups. There is no relationship between the chest pains and exertion. The SOB comes and goes, but this does get worse with exertion. She also says her vision has become slightly blurred the past 2 weeks. Of note her A1c in January 2023 was 6.9%, but she has not followed up with us  since then. Her BP has been stable at home.    Review of Systems  Constitutional: Negative.   Eyes:  Positive for visual disturbance.  Respiratory:  Positive for shortness of breath. Negative for cough and wheezing.   Cardiovascular:  Positive for chest pain. Negative for palpitations and leg swelling.  Gastrointestinal: Negative.   Neurological: Negative.        Objective:   Physical Exam Constitutional:      Appearance: Normal appearance. She is not ill-appearing.  Cardiovascular:     Rate and Rhythm: Normal rate and regular rhythm.     Pulses: Normal pulses.     Heart sounds: Normal heart sounds.  Pulmonary:     Effort: Pulmonary effort is normal.     Breath sounds: Normal breath sounds.  Musculoskeletal:     Right lower leg: No edema.     Left lower leg: No edema.  Neurological:     Mental Status: She is alert.           Assessment & Plan:  Her chest pains are due to costochondritis. I explained the nature of these, and she can use Ibuprofen  as needed. The SOB may be the result of poor conditioning. With the blurred, I am concerned that her diabetes has gotten worse. Her HTN is stable. We will get labs today including an A1c. Garnette Olmsted, MD

## 2024-07-25 NOTE — Addendum Note (Signed)
 Addended by: JOHNNY SENIOR A on: 07/25/2024 10:37 AM   Modules accepted: Orders

## 2024-07-26 ENCOUNTER — Ambulatory Visit: Payer: Self-pay | Admitting: Family Medicine

## 2024-07-26 DIAGNOSIS — E119 Type 2 diabetes mellitus without complications: Secondary | ICD-10-CM

## 2024-07-26 DIAGNOSIS — E876 Hypokalemia: Secondary | ICD-10-CM

## 2024-07-29 MED ORDER — POTASSIUM CHLORIDE ER 10 MEQ PO TBCR: 10.0000 meq | EXTENDED_RELEASE_TABLET | Freq: Every day | ORAL | 3 refills | Status: AC

## 2024-07-31 ENCOUNTER — Encounter: Payer: Self-pay | Admitting: Family Medicine

## 2024-07-31 ENCOUNTER — Telehealth (INDEPENDENT_AMBULATORY_CARE_PROVIDER_SITE_OTHER): Payer: PRIVATE HEALTH INSURANCE | Admitting: Family Medicine

## 2024-07-31 DIAGNOSIS — E876 Hypokalemia: Secondary | ICD-10-CM

## 2024-07-31 DIAGNOSIS — M940 Chondrocostal junction syndrome [Tietze]: Secondary | ICD-10-CM | POA: Diagnosis not present

## 2024-07-31 DIAGNOSIS — D72819 Decreased white blood cell count, unspecified: Secondary | ICD-10-CM

## 2024-07-31 DIAGNOSIS — E119 Type 2 diabetes mellitus without complications: Secondary | ICD-10-CM

## 2024-07-31 NOTE — Progress Notes (Signed)
 Subjective:    Patient ID: Morgan Doyle, female    DOB: 1966/07/10, 58 y.o.   MRN: [id number]  HPI Virtual Visit via Video Note  I connected with the patient on 07/31/24 at  2:45 PM EDT by a video enabled telemedicine application and verified that I am speaking with the correct person using two identifiers.  Location patient: home Location provider:work or home office Persons participating in the virtual visit: patient, provider  I discussed the limitations of evaluation and management by telemedicine and the availability of in person appointments. The patient expressed understanding and agreed to proceed.   HPI: Here to follow up on our visit on 07-25-24 where we discussed her chest pains and some fatigue. We diagnosed her with costochondritis, and this has been improving as expected. We also did lab work, and she has a number of questions about these results. These were mostly remarkable for an A1c of 6.9%, a WBC count of 3.0, and a potassium of 3.1. We started her on potassium supplementation, and we planned to check a follow up BMET in 90 days. I explained that a possible explanation for the low WBC is she may have a low level virus infection in her body, and this could also explain the fatigue and the costochondritis.    ROS: See pertinent positives and negatives per HPI.  Past Medical History:  Diagnosis Date   Allergy    seasonal   Anemia    History of anemia   Arthritis    no meds   Carpal tunnel syndrome, bilateral    get injections bilteral   Headache(784.0)    OTC excedrin    Hypertension     Past Surgical History:  Procedure Laterality Date   ABDOMINAL HYSTERECTOMY  08-10-11   TAH and left SO per Dr. Sarrah    COLONOSCOPY  01/12/2016   per Dr. Shila, benign polyps, repeat in 10 yrs    CRYOABLATION     IN oFFICE PER Pt - 1990's   CYSTOSCOPY  08/10/2011   Procedure: CYSTOSCOPY;  Surgeon: Rosaline DELENA Sarrah;  Location: [facility];  Service: Gynecology;;    DILATION AND EVACUATION      2006  MAB   svd     1996    Family History  Problem Relation Age of Onset   Colon polyps Mother    Clotting disorder Mother    Colon cancer Paternal Uncle    Stomach cancer Paternal Uncle    Breast cancer Paternal Aunt    Esophageal cancer Neg Hx    Rectal cancer Neg Hx      Current Outpatient Medications:    albuterol  (VENTOLIN  HFA) 108 (90 Base) MCG/ACT inhaler, TAKE 2 PUFFS BY MOUTH EVERY 6 HOURS AS NEEDED, Disp: 8.5 each, Rfl: 1   amLODipine  (NORVASC ) 5 MG tablet, Take 1 tablet (5 mg total) by mouth daily., Disp: 90 tablet, Rfl: 3   ezetimibe  (ZETIA ) 10 MG tablet, TAKE 1 TABLET BY MOUTH EVERY DAY **MUST USE MAIL ORDER FOR MAITENANCE DRUGS**, Disp: 90 tablet, Rfl: 3   fexofenadine (ALLEGRA) 180 MG tablet, Take 180 mg by mouth once as needed., Disp: , Rfl:    ibuprofen  (ADVIL ,MOTRIN ) 200 MG tablet, Take 200 mg by mouth as needed., Disp: , Rfl:    ISOtretinoin (ACCUTANE) 40 MG capsule, Take 40 mg by mouth 2 (two) times daily., Disp: , Rfl:    potassium chloride  (KLOR-CON  10) 10 MEQ tablet, Take 1 tablet (10 mEq total) by mouth daily.,  Disp: 90 tablet, Rfl: 3   triamterene -hydrochlorothiazide  (DYAZIDE ) 37.5-25 MG capsule, TAKE 1 CAPSULE BY MOUTH EVERY DAY IN THE MORNING (MAIL ORDER), Disp: 90 capsule, Rfl: 3   valACYclovir  (VALTREX ) 500 MG tablet, TAKE 1 TABLET BY MOUTH EVERY DAY AS NEEDED, Disp: 90 tablet, Rfl: 3  EXAM:  VITALS per patient if applicable:  GENERAL: alert, oriented, appears well and in no acute distress  HEENT: atraumatic, conjunttiva clear, no obvious abnormalities on inspection of external nose and ears  NECK: normal movements of the head and neck  LUNGS: on inspection no signs of respiratory distress, breathing rate appears normal, no obvious gross SOB, gasping or wheezing  CV: no obvious cyanosis  MS: moves all visible extremities without noticeable abnormality  PSYCH/NEURO: pleasant and cooperative, no obvious depression  or anxiety, speech and thought processing grossly intact  ASSESSMENT AND PLAN: She seems to be recovering from a viral illness of some sort. The chest pains are subsiding, and her energy is coming back. We will check another CBC in 90 days as well. Per her request, we will refer her to Warren Batty NP at [facility] for the diabetes. Garnette Olmsted, MD  Discussed the following assessment and plan:  No diagnosis found.     I discussed the assessment and treatment plan with the patient. The patient was provided an opportunity to ask questions and all were answered. The patient agreed with the plan and demonstrated an understanding of the instructions.   The patient was advised to call back or seek an in-person evaluation if the symptoms worsen or if the condition fails to improve as anticipated.      Review of Systems     Objective:   Physical Exam        Assessment & Plan:

## 2024-08-15 ENCOUNTER — Telehealth: Payer: Self-pay | Admitting: Family Medicine

## 2024-08-15 NOTE — Telephone Encounter (Signed)
 Copied from CRM [id number]. Topic: Referral - Status >> Aug 15, 2024 11:35 AM [place] E wrote: Reason for CRM: Patient called in regarding the status of her endocrinology referral, stated this this has been authorized and that facility should be reaching out to schedule an appointment, patient then stated she spoke with Tawni B., the referral coordinator at the endocrinology office and they did not see a referral come through. Patient would also like to know if a dietician referral as been placed as well.

## 2024-08-16 ENCOUNTER — Other Ambulatory Visit: Payer: Self-pay

## 2024-08-16 DIAGNOSIS — E119 Type 2 diabetes mellitus without complications: Secondary | ICD-10-CM

## 2024-10-03 ENCOUNTER — Ambulatory Visit: Payer: PRIVATE HEALTH INSURANCE | Admitting: Skilled Nursing Facility1

## 2024-10-22 ENCOUNTER — Encounter: Payer: Self-pay | Admitting: Obstetrics and Gynecology

## 2024-10-30 ENCOUNTER — Other Ambulatory Visit: Payer: PRIVATE HEALTH INSURANCE

## 2024-12-03 ENCOUNTER — Other Ambulatory Visit: Payer: Self-pay

## 2024-12-03 MED ORDER — AMLODIPINE BESYLATE 5 MG PO TABS: 5.0000 mg | ORAL_TABLET | Freq: Every day | ORAL | 1 refills | Status: AC

## 2024-12-10 ENCOUNTER — Ambulatory Visit
Admission: RE | Admit: 2024-12-10 | Discharge: 2024-12-10 | Disposition: A | Discharge status: Home / Self Care | Payer: PRIVATE HEALTH INSURANCE | Source: Ambulatory Visit | Attending: Obstetrics and Gynecology | Admitting: Obstetrics and Gynecology

## 2024-12-10 ENCOUNTER — Other Ambulatory Visit (INDEPENDENT_AMBULATORY_CARE_PROVIDER_SITE_OTHER): Payer: PRIVATE HEALTH INSURANCE

## 2024-12-10 DIAGNOSIS — E119 Type 2 diabetes mellitus without complications: Secondary | ICD-10-CM | POA: Diagnosis not present

## 2024-12-10 DIAGNOSIS — D72819 Decreased white blood cell count, unspecified: Secondary | ICD-10-CM | POA: Diagnosis not present

## 2024-12-10 DIAGNOSIS — N6310 Unspecified lump in the right breast, unspecified quadrant: Secondary | ICD-10-CM

## 2024-12-10 DIAGNOSIS — E876 Hypokalemia: Secondary | ICD-10-CM

## 2024-12-10 LAB — BASIC METABOLIC PANEL WITH GFR
BUN: 18 mg/dL (ref 6–23)
CO2: 31 meq/L (ref 19–32)
Calcium: 10 mg/dL (ref 8.4–10.5)
Chloride: 103 meq/L (ref 96–112)
Creatinine, Ser: 0.84 mg/dL (ref 0.40–1.20)
GFR: 75.58 mL/min
Glucose, Bld: 79 mg/dL (ref 70–99)
Potassium: 4.1 meq/L (ref 3.5–5.1)
Sodium: 140 meq/L (ref 135–145)

## 2024-12-10 LAB — CBC WITH DIFFERENTIAL/PLATELET
Basophils Absolute: 0 K/uL (ref 0.0–0.1)
Basophils Relative: 0.8 % (ref 0.0–3.0)
Eosinophils Absolute: 0.1 K/uL (ref 0.0–0.7)
Eosinophils Relative: 1.1 % (ref 0.0–5.0)
HCT: 43.7 % (ref 36.0–46.0)
Hemoglobin: 14.1 g/dL (ref 12.0–15.0)
Lymphocytes Relative: 45 % (ref 12.0–46.0)
Lymphs Abs: 2.4 K/uL (ref 0.7–4.0)
MCHC: 32.3 g/dL (ref 30.0–36.0)
MCV: 78 fl (ref 78.0–100.0)
Monocytes Absolute: 0.5 K/uL (ref 0.1–1.0)
Monocytes Relative: 8.7 % (ref 3.0–12.0)
Neutro Abs: 2.3 K/uL (ref 1.4–7.7)
Neutrophils Relative %: 44.4 % (ref 43.0–77.0)
Platelets: 298 K/uL (ref 150.0–400.0)
RBC: 5.61 Mil/uL — ABNORMAL HIGH (ref 3.87–5.11)
RDW: 16.2 % — ABNORMAL HIGH (ref 11.5–15.5)
WBC: 5.3 K/uL (ref 4.0–10.5)

## 2024-12-10 LAB — HEMOGLOBIN A1C: Hgb A1c MFr Bld: 6.8 % — ABNORMAL HIGH (ref 4.6–6.5)

## 2024-12-11 ENCOUNTER — Ambulatory Visit: Payer: Self-pay | Admitting: Family Medicine
